# Patient Record
Sex: Female | Born: 1950 | Race: White | Hispanic: No | Smoking: Current every day smoker
Health system: Southern US, Community
[De-identification: ages and names within clinical notes are randomized; demographics above are authoritative.]

## PROBLEM LIST (undated history)

## (undated) DIAGNOSIS — I495 Sick sinus syndrome: Secondary | ICD-10-CM

## (undated) DIAGNOSIS — I480 Paroxysmal atrial fibrillation: Secondary | ICD-10-CM

## (undated) DIAGNOSIS — Z9119 Patient's noncompliance with other medical treatment and regimen: Secondary | ICD-10-CM

## (undated) DIAGNOSIS — I255 Ischemic cardiomyopathy: Secondary | ICD-10-CM

## (undated) DIAGNOSIS — Z91199 Patient's noncompliance with other medical treatment and regimen due to unspecified reason: Secondary | ICD-10-CM

## (undated) DIAGNOSIS — D649 Anemia, unspecified: Secondary | ICD-10-CM

## (undated) DIAGNOSIS — I4892 Unspecified atrial flutter: Secondary | ICD-10-CM

## (undated) DIAGNOSIS — I2510 Atherosclerotic heart disease of native coronary artery without angina pectoris: Secondary | ICD-10-CM

## (undated) DIAGNOSIS — E785 Hyperlipidemia, unspecified: Secondary | ICD-10-CM

## (undated) DIAGNOSIS — J449 Chronic obstructive pulmonary disease, unspecified: Secondary | ICD-10-CM

## (undated) DIAGNOSIS — I5022 Chronic systolic (congestive) heart failure: Secondary | ICD-10-CM

## (undated) DIAGNOSIS — E119 Type 2 diabetes mellitus without complications: Secondary | ICD-10-CM

## (undated) DIAGNOSIS — G2581 Restless legs syndrome: Secondary | ICD-10-CM

## (undated) DIAGNOSIS — I10 Essential (primary) hypertension: Secondary | ICD-10-CM

## (undated) DIAGNOSIS — M1990 Unspecified osteoarthritis, unspecified site: Secondary | ICD-10-CM

## (undated) HISTORY — PX: CARPAL TUNNEL RELEASE: SHX101

## (undated) HISTORY — DX: Essential (primary) hypertension: I10

## (undated) HISTORY — DX: Ischemic cardiomyopathy: I25.5

## (undated) HISTORY — DX: Patient's noncompliance with other medical treatment and regimen due to unspecified reason: Z91.199

## (undated) HISTORY — DX: Hyperlipidemia, unspecified: E78.5

## (undated) HISTORY — DX: Unspecified osteoarthritis, unspecified site: M19.90

## (undated) HISTORY — DX: Atherosclerotic heart disease of native coronary artery without angina pectoris: I25.10

## (undated) HISTORY — DX: Sick sinus syndrome: I49.5

## (undated) HISTORY — DX: Anemia, unspecified: D64.9

## (undated) HISTORY — DX: Patient's noncompliance with other medical treatment and regimen: Z91.19

## (undated) HISTORY — PX: TOTAL ABDOMINAL HYSTERECTOMY: SHX209

## (undated) HISTORY — DX: Chronic systolic (congestive) heart failure: I50.22

## (undated) HISTORY — DX: Chronic obstructive pulmonary disease, unspecified: J44.9

## (undated) HISTORY — DX: Paroxysmal atrial fibrillation: I48.0

## (undated) HISTORY — DX: Type 2 diabetes mellitus without complications: E11.9

## (undated) HISTORY — DX: Restless legs syndrome: G25.81

## (undated) HISTORY — DX: Unspecified atrial flutter: I48.92

---

## 2007-08-10 HISTORY — PX: CORONARY ARTERY BYPASS GRAFT: SHX141

## 2007-08-12 ENCOUNTER — Inpatient Hospital Stay (HOSPITAL_COMMUNITY): Admission: EM | Admit: 2007-08-12 | Discharge: 2007-08-26 | Payer: Self-pay | Admitting: Family Medicine

## 2007-08-12 ENCOUNTER — Ambulatory Visit: Payer: Self-pay | Admitting: Cardiology

## 2007-08-12 ENCOUNTER — Encounter: Payer: Self-pay | Admitting: Family Medicine

## 2007-08-12 ENCOUNTER — Ambulatory Visit: Payer: Self-pay | Admitting: Cardiothoracic Surgery

## 2007-08-15 ENCOUNTER — Encounter: Payer: Self-pay | Admitting: Cardiothoracic Surgery

## 2007-08-16 ENCOUNTER — Encounter: Payer: Self-pay | Admitting: Cardiothoracic Surgery

## 2007-08-30 ENCOUNTER — Ambulatory Visit: Payer: Self-pay | Admitting: Cardiology

## 2007-09-09 ENCOUNTER — Encounter: Admission: RE | Admit: 2007-09-09 | Discharge: 2007-09-09 | Payer: Self-pay | Admitting: Cardiothoracic Surgery

## 2007-09-09 ENCOUNTER — Ambulatory Visit: Payer: Self-pay | Admitting: Cardiothoracic Surgery

## 2007-09-21 ENCOUNTER — Ambulatory Visit: Payer: Self-pay | Admitting: Cardiology

## 2007-10-11 ENCOUNTER — Ambulatory Visit: Payer: Self-pay | Admitting: Cardiology

## 2007-10-13 ENCOUNTER — Ambulatory Visit: Payer: Self-pay | Admitting: Cardiology

## 2007-10-18 ENCOUNTER — Ambulatory Visit: Payer: Self-pay | Admitting: Cardiology

## 2007-10-25 ENCOUNTER — Ambulatory Visit: Payer: Self-pay | Admitting: Cardiology

## 2007-10-31 ENCOUNTER — Ambulatory Visit: Payer: Self-pay | Admitting: Cardiology

## 2007-11-15 ENCOUNTER — Ambulatory Visit: Payer: Self-pay | Admitting: Cardiology

## 2008-01-25 ENCOUNTER — Ambulatory Visit: Payer: Self-pay | Admitting: Cardiology

## 2008-02-13 ENCOUNTER — Ambulatory Visit: Payer: Self-pay | Admitting: Cardiology

## 2008-02-24 ENCOUNTER — Ambulatory Visit: Payer: Self-pay | Admitting: Cardiology

## 2008-03-13 ENCOUNTER — Ambulatory Visit: Payer: Self-pay | Admitting: Cardiology

## 2008-03-16 ENCOUNTER — Ambulatory Visit: Payer: Self-pay | Admitting: Cardiology

## 2008-03-23 ENCOUNTER — Ambulatory Visit: Payer: Self-pay | Admitting: Cardiology

## 2008-03-29 ENCOUNTER — Ambulatory Visit: Payer: Self-pay | Admitting: Cardiology

## 2008-04-06 ENCOUNTER — Ambulatory Visit: Payer: Self-pay | Admitting: Cardiology

## 2008-04-13 ENCOUNTER — Ambulatory Visit: Payer: Self-pay | Admitting: Cardiology

## 2008-04-20 ENCOUNTER — Ambulatory Visit: Payer: Self-pay | Admitting: Cardiothoracic Surgery

## 2008-04-20 ENCOUNTER — Ambulatory Visit: Payer: Self-pay | Admitting: Cardiology

## 2008-05-08 ENCOUNTER — Ambulatory Visit: Payer: Self-pay | Admitting: Cardiology

## 2008-05-18 ENCOUNTER — Ambulatory Visit: Payer: Self-pay | Admitting: Cardiology

## 2008-05-21 ENCOUNTER — Ambulatory Visit: Payer: Self-pay | Admitting: Cardiology

## 2008-05-25 ENCOUNTER — Ambulatory Visit: Payer: Self-pay | Admitting: Cardiothoracic Surgery

## 2008-05-25 ENCOUNTER — Encounter: Admission: RE | Admit: 2008-05-25 | Discharge: 2008-05-25 | Payer: Self-pay | Admitting: Cardiothoracic Surgery

## 2008-06-08 ENCOUNTER — Ambulatory Visit: Payer: Self-pay | Admitting: Cardiology

## 2008-06-11 HISTORY — PX: STERNAL INCISION RECLOSURE: SHX2442

## 2008-06-22 ENCOUNTER — Ambulatory Visit: Payer: Self-pay | Admitting: Cardiothoracic Surgery

## 2008-06-27 ENCOUNTER — Ambulatory Visit: Payer: Self-pay | Admitting: Cardiothoracic Surgery

## 2008-06-27 ENCOUNTER — Inpatient Hospital Stay (HOSPITAL_COMMUNITY): Admission: RE | Admit: 2008-06-27 | Discharge: 2008-06-30 | Payer: Self-pay | Admitting: Cardiothoracic Surgery

## 2008-07-13 ENCOUNTER — Ambulatory Visit: Payer: Self-pay | Admitting: Cardiothoracic Surgery

## 2008-07-16 ENCOUNTER — Ambulatory Visit: Payer: Self-pay | Admitting: Cardiology

## 2008-08-16 ENCOUNTER — Ambulatory Visit: Payer: Self-pay | Admitting: Cardiothoracic Surgery

## 2008-08-31 ENCOUNTER — Ambulatory Visit: Payer: Self-pay | Admitting: Cardiology

## 2008-08-31 ENCOUNTER — Encounter: Admission: RE | Admit: 2008-08-31 | Discharge: 2008-08-31 | Payer: Self-pay | Admitting: Cardiothoracic Surgery

## 2008-08-31 ENCOUNTER — Ambulatory Visit: Payer: Self-pay | Admitting: Cardiothoracic Surgery

## 2008-09-11 ENCOUNTER — Ambulatory Visit: Payer: Self-pay | Admitting: Cardiology

## 2008-09-20 ENCOUNTER — Encounter: Payer: Self-pay | Admitting: Cardiology

## 2008-09-20 ENCOUNTER — Ambulatory Visit: Payer: Self-pay | Admitting: Cardiology

## 2008-10-29 ENCOUNTER — Encounter: Payer: Self-pay | Admitting: Physician Assistant

## 2008-10-29 ENCOUNTER — Ambulatory Visit: Payer: Self-pay | Admitting: Cardiology

## 2008-10-30 ENCOUNTER — Encounter: Payer: Self-pay | Admitting: Cardiology

## 2008-11-01 ENCOUNTER — Telehealth: Payer: Self-pay | Admitting: Cardiology

## 2008-11-02 ENCOUNTER — Encounter: Payer: Self-pay | Admitting: Cardiology

## 2008-11-05 ENCOUNTER — Encounter: Payer: Self-pay | Admitting: Cardiology

## 2008-11-13 ENCOUNTER — Encounter: Payer: Self-pay | Admitting: Cardiology

## 2008-12-14 ENCOUNTER — Ambulatory Visit: Payer: Self-pay | Admitting: Cardiology

## 2008-12-14 ENCOUNTER — Encounter: Payer: Self-pay | Admitting: Physician Assistant

## 2009-01-22 DIAGNOSIS — E785 Hyperlipidemia, unspecified: Secondary | ICD-10-CM

## 2009-01-22 DIAGNOSIS — I4892 Unspecified atrial flutter: Secondary | ICD-10-CM | POA: Insufficient documentation

## 2009-02-04 ENCOUNTER — Encounter: Payer: Self-pay | Admitting: Cardiology

## 2009-02-15 ENCOUNTER — Encounter: Payer: Self-pay | Admitting: Cardiology

## 2009-02-15 ENCOUNTER — Ambulatory Visit: Payer: Self-pay | Admitting: Cardiology

## 2009-02-17 ENCOUNTER — Encounter: Payer: Self-pay | Admitting: Cardiology

## 2009-02-17 ENCOUNTER — Telehealth (INDEPENDENT_AMBULATORY_CARE_PROVIDER_SITE_OTHER): Payer: Self-pay | Admitting: Physician Assistant

## 2009-02-19 ENCOUNTER — Encounter: Payer: Self-pay | Admitting: Cardiology

## 2009-02-20 ENCOUNTER — Encounter: Payer: Self-pay | Admitting: Cardiology

## 2009-02-21 ENCOUNTER — Encounter: Payer: Self-pay | Admitting: Cardiology

## 2009-02-25 ENCOUNTER — Telehealth (INDEPENDENT_AMBULATORY_CARE_PROVIDER_SITE_OTHER): Payer: Self-pay

## 2009-02-26 ENCOUNTER — Ambulatory Visit: Payer: Self-pay

## 2009-02-26 ENCOUNTER — Ambulatory Visit: Payer: Self-pay | Admitting: Cardiovascular Disease

## 2009-02-26 ENCOUNTER — Encounter (HOSPITAL_COMMUNITY): Admission: RE | Admit: 2009-02-26 | Discharge: 2009-05-09 | Payer: Self-pay | Admitting: Internal Medicine

## 2009-03-14 ENCOUNTER — Encounter: Payer: Self-pay | Admitting: Cardiology

## 2009-03-18 ENCOUNTER — Ambulatory Visit: Payer: Self-pay | Admitting: Internal Medicine

## 2009-03-18 DIAGNOSIS — I495 Sick sinus syndrome: Secondary | ICD-10-CM

## 2009-03-19 ENCOUNTER — Encounter: Payer: Self-pay | Admitting: Internal Medicine

## 2009-03-19 ENCOUNTER — Telehealth (INDEPENDENT_AMBULATORY_CARE_PROVIDER_SITE_OTHER): Payer: Self-pay | Admitting: *Deleted

## 2009-03-22 ENCOUNTER — Ambulatory Visit: Payer: Self-pay | Admitting: Cardiology

## 2009-03-22 LAB — CONVERTED CEMR LAB: POC INR: 1.9

## 2009-03-26 ENCOUNTER — Ambulatory Visit: Payer: Self-pay | Admitting: Cardiology

## 2009-03-26 ENCOUNTER — Encounter: Payer: Self-pay | Admitting: Internal Medicine

## 2009-03-26 ENCOUNTER — Encounter (INDEPENDENT_AMBULATORY_CARE_PROVIDER_SITE_OTHER): Payer: Self-pay | Admitting: *Deleted

## 2009-03-26 ENCOUNTER — Telehealth: Payer: Self-pay | Admitting: Internal Medicine

## 2009-03-26 LAB — CONVERTED CEMR LAB: POC INR: 4.7

## 2009-03-28 ENCOUNTER — Encounter: Payer: Self-pay | Admitting: Internal Medicine

## 2009-03-29 ENCOUNTER — Encounter: Payer: Self-pay | Admitting: Cardiology

## 2009-03-29 ENCOUNTER — Encounter: Payer: Self-pay | Admitting: Internal Medicine

## 2009-03-29 ENCOUNTER — Inpatient Hospital Stay (HOSPITAL_COMMUNITY): Admission: RE | Admit: 2009-03-29 | Discharge: 2009-03-30 | Payer: Self-pay | Admitting: Internal Medicine

## 2009-03-29 ENCOUNTER — Ambulatory Visit: Payer: Self-pay | Admitting: Cardiology

## 2009-03-29 HISTORY — PX: CARDIAC DEFIBRILLATOR PLACEMENT: SHX171

## 2009-03-30 ENCOUNTER — Encounter: Payer: Self-pay | Admitting: Internal Medicine

## 2009-04-01 ENCOUNTER — Encounter: Payer: Self-pay | Admitting: Internal Medicine

## 2009-04-02 ENCOUNTER — Ambulatory Visit: Payer: Self-pay | Admitting: Cardiology

## 2009-04-02 LAB — CONVERTED CEMR LAB: POC INR: 2

## 2009-04-09 ENCOUNTER — Ambulatory Visit: Payer: Self-pay | Admitting: Cardiology

## 2009-04-09 LAB — CONVERTED CEMR LAB: POC INR: 3.1

## 2009-04-15 ENCOUNTER — Encounter: Payer: Self-pay | Admitting: Physician Assistant

## 2009-04-15 ENCOUNTER — Ambulatory Visit: Payer: Self-pay | Admitting: Cardiology

## 2009-04-17 ENCOUNTER — Encounter: Payer: Self-pay | Admitting: Internal Medicine

## 2009-04-17 ENCOUNTER — Ambulatory Visit: Payer: Self-pay

## 2009-04-19 ENCOUNTER — Telehealth (INDEPENDENT_AMBULATORY_CARE_PROVIDER_SITE_OTHER): Payer: Self-pay | Admitting: *Deleted

## 2009-05-29 ENCOUNTER — Encounter (INDEPENDENT_AMBULATORY_CARE_PROVIDER_SITE_OTHER): Payer: Self-pay | Admitting: Cardiology

## 2009-06-12 ENCOUNTER — Ambulatory Visit (HOSPITAL_COMMUNITY): Admission: RE | Admit: 2009-06-12 | Discharge: 2009-06-12 | Payer: Self-pay | Admitting: Internal Medicine

## 2009-06-12 ENCOUNTER — Encounter: Payer: Self-pay | Admitting: Cardiology

## 2009-06-13 ENCOUNTER — Encounter: Payer: Self-pay | Admitting: Cardiology

## 2009-06-16 ENCOUNTER — Encounter: Payer: Self-pay | Admitting: Cardiology

## 2009-07-05 IMAGING — CR DG CHEST 2V
2 series · 2 of 2 positions shown · non-contrast
Comparison: 05/25/2008

CLINICAL DATA: Sternal malunion.  Preadmission.

CHEST - 2 VIEW

[view not recorded (1 of 2)]
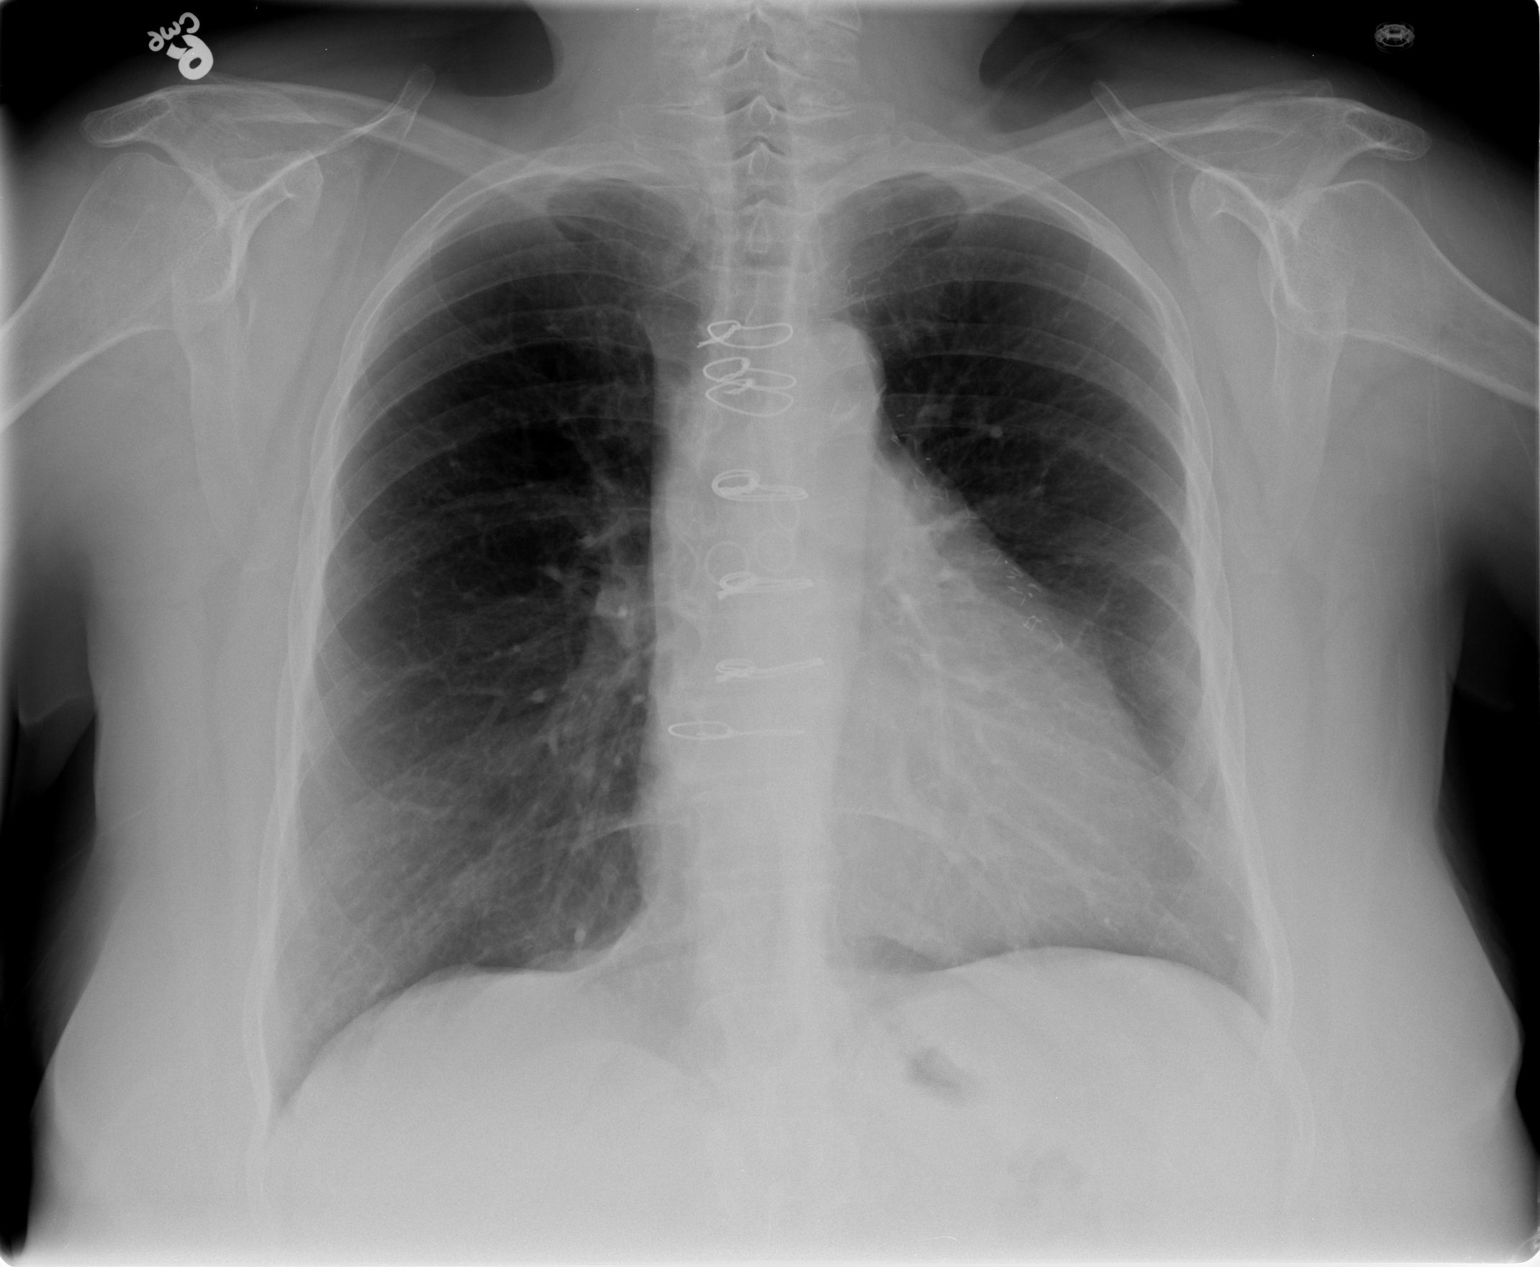

[view not recorded (2 of 2)]
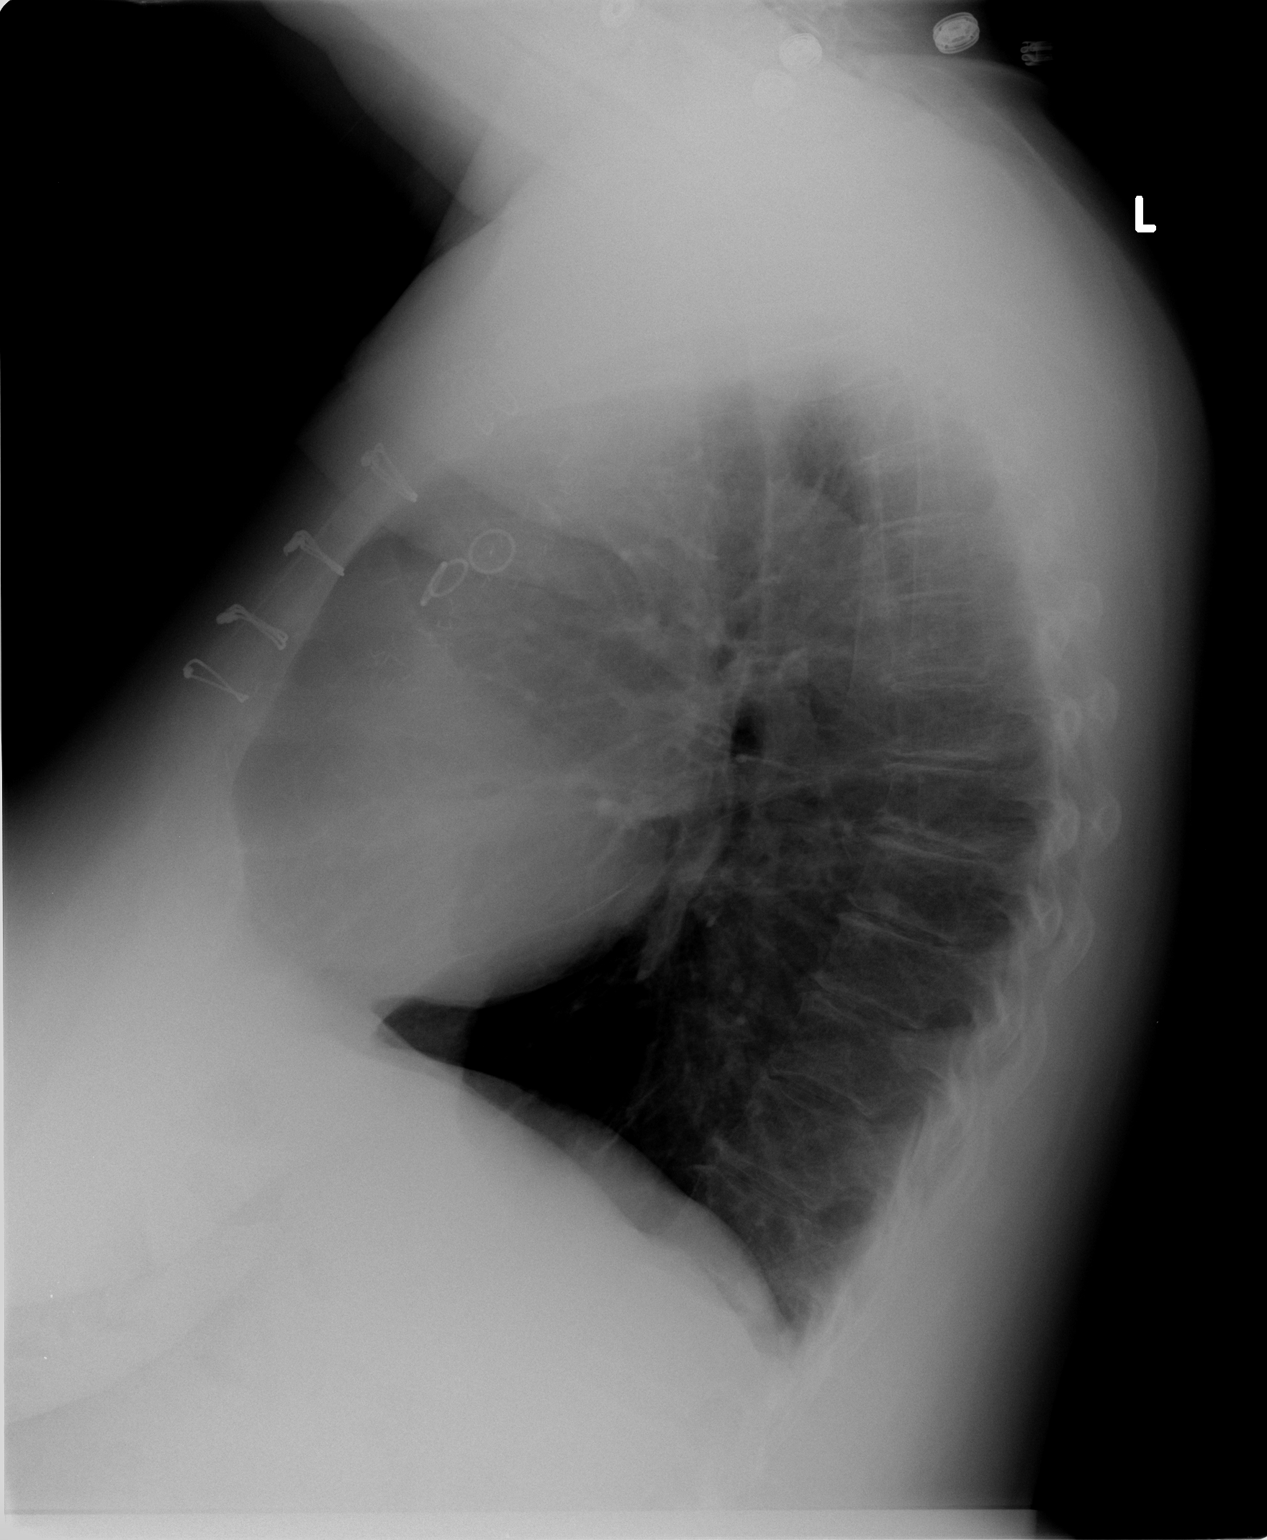

[2 of 2 positions shown; findings below may reference images not displayed]

FINDINGS: Trachea is midline. Heart is enlarged, stable.  Sternal
wires are unchanged in position from 09/09/2007.  A probable
summation shadow is seen at the base of the right hemithorax.
Subsegmental atelectasis or scarring is seen in the lingula. Lungs
are otherwise clear.  No pleural fluid.
IMPRESSION: No acute findings.

## 2009-08-02 ENCOUNTER — Ambulatory Visit: Payer: Self-pay | Admitting: Internal Medicine

## 2009-09-06 ENCOUNTER — Ambulatory Visit: Payer: Self-pay | Admitting: Cardiology

## 2009-09-06 DIAGNOSIS — I10 Essential (primary) hypertension: Secondary | ICD-10-CM

## 2009-09-06 DIAGNOSIS — Z720 Tobacco use: Secondary | ICD-10-CM

## 2009-09-06 DIAGNOSIS — I2589 Other forms of chronic ischemic heart disease: Secondary | ICD-10-CM | POA: Insufficient documentation

## 2009-09-06 DIAGNOSIS — I2510 Atherosclerotic heart disease of native coronary artery without angina pectoris: Secondary | ICD-10-CM

## 2009-09-12 ENCOUNTER — Encounter: Payer: Self-pay | Admitting: Cardiology

## 2009-09-16 ENCOUNTER — Telehealth (INDEPENDENT_AMBULATORY_CARE_PROVIDER_SITE_OTHER): Payer: Self-pay | Admitting: *Deleted

## 2009-10-10 ENCOUNTER — Ambulatory Visit: Payer: Self-pay | Admitting: Cardiology

## 2009-10-29 ENCOUNTER — Ambulatory Visit: Payer: Self-pay | Admitting: Internal Medicine

## 2009-11-14 ENCOUNTER — Encounter: Payer: Self-pay | Admitting: Internal Medicine

## 2009-11-15 ENCOUNTER — Ambulatory Visit: Payer: Self-pay | Admitting: Cardiology

## 2009-11-15 DIAGNOSIS — I5022 Chronic systolic (congestive) heart failure: Secondary | ICD-10-CM

## 2009-12-04 ENCOUNTER — Encounter: Payer: Self-pay | Admitting: Cardiology

## 2009-12-13 ENCOUNTER — Encounter: Payer: Self-pay | Admitting: Cardiology

## 2009-12-17 ENCOUNTER — Encounter: Payer: Self-pay | Admitting: Cardiology

## 2010-01-30 ENCOUNTER — Ambulatory Visit: Payer: Self-pay | Admitting: Internal Medicine

## 2010-02-07 ENCOUNTER — Ambulatory Visit: Payer: Self-pay | Admitting: Internal Medicine

## 2010-03-05 ENCOUNTER — Encounter: Payer: Self-pay | Admitting: Cardiology

## 2010-03-12 ENCOUNTER — Encounter: Payer: Self-pay | Admitting: Internal Medicine

## 2010-03-14 ENCOUNTER — Ambulatory Visit: Payer: Self-pay | Admitting: Internal Medicine

## 2010-03-14 LAB — CONVERTED CEMR LAB
Basophils Absolute: 0.1 10*3/uL (ref 0.0–0.1)
CO2: 29 meq/L (ref 19–32)
Calcium: 9.4 mg/dL (ref 8.4–10.5)
Chloride: 97 meq/L (ref 96–112)
Creatinine, Ser: 1.7 mg/dL — ABNORMAL HIGH (ref 0.4–1.2)
Glucose, Bld: 285 mg/dL — ABNORMAL HIGH (ref 70–99)
Hemoglobin: 10.5 g/dL — ABNORMAL LOW (ref 12.0–15.0)
Lymphocytes Relative: 21.9 % (ref 12.0–46.0)
Lymphs Abs: 1.6 10*3/uL (ref 0.7–4.0)
MCHC: 34.6 g/dL (ref 30.0–36.0)
Monocytes Absolute: 0.5 10*3/uL (ref 0.1–1.0)
Neutrophils Relative %: 65.3 % (ref 43.0–77.0)
Platelets: 282 10*3/uL (ref 150.0–400.0)
Sodium: 136 meq/L (ref 135–145)

## 2010-03-19 ENCOUNTER — Telehealth: Payer: Self-pay | Admitting: Internal Medicine

## 2010-03-20 ENCOUNTER — Telehealth: Payer: Self-pay | Admitting: Internal Medicine

## 2010-04-01 ENCOUNTER — Ambulatory Visit (HOSPITAL_COMMUNITY)
Admission: RE | Admit: 2010-04-01 | Discharge: 2010-04-01 | Payer: Self-pay | Source: Home / Self Care | Admitting: Internal Medicine

## 2010-04-01 ENCOUNTER — Ambulatory Visit: Payer: Self-pay | Admitting: Internal Medicine

## 2010-05-08 ENCOUNTER — Ambulatory Visit: Payer: Self-pay | Admitting: Internal Medicine

## 2010-05-14 ENCOUNTER — Encounter: Payer: Self-pay | Admitting: Cardiology

## 2010-05-21 ENCOUNTER — Encounter (INDEPENDENT_AMBULATORY_CARE_PROVIDER_SITE_OTHER): Payer: Self-pay | Admitting: *Deleted

## 2010-06-02 ENCOUNTER — Ambulatory Visit
Admission: RE | Admit: 2010-06-02 | Discharge: 2010-06-02 | Payer: Self-pay | Source: Home / Self Care | Attending: Internal Medicine | Admitting: Internal Medicine

## 2010-06-02 ENCOUNTER — Encounter: Payer: Self-pay | Admitting: Internal Medicine

## 2010-06-02 DIAGNOSIS — N182 Chronic kidney disease, stage 2 (mild): Secondary | ICD-10-CM | POA: Insufficient documentation

## 2010-06-03 ENCOUNTER — Encounter: Payer: Self-pay | Admitting: Internal Medicine

## 2010-06-10 NOTE — Medication Information (Signed)
Summary: Coumadin Clinic  Anticoagulant Therapy  Managed by: Inactive Referring MD: Diona Browner PCP: Dr. Erasmo Downer Supervising MD: Andee Lineman MD, Michelle Piper Indication 1: Atrial Fibrillation Lab Used: [facility] [place] Site: [place]          Comments: Coumadin discontinued 1 month post ablation per Dr Diona Browner office note.  Allergies: 1)  ! Pcn 2)  ! * Esgesic 3)  ! Nsaids 4)  ! * Latex  Anticoagulation Management History:      Negative risk factors for bleeding include an age less than 32 years old.  The bleeding index is 'low risk'.  Positive CHADS2 values include History of CHF and History of HTN.  Negative CHADS2 values include Age > 73 years old.  Anticoagulation responsible provider: Andee Lineman MD, Michelle Piper.    Anticoagulation Management Assessment/Plan:      The patient's current anticoagulation dose is Coumadin 5 mg tabs: take one daily as directed.  The next INR is due 04/16/2009.  Anticoagulation instructions were given to patient.  Results were reviewed/authorized by Inactive.         Prior Anticoagulation Instructions: INR 3.1 Decrease coumadin to 2.5mg  once daily except 5mg  on Sundays and Thursdays

## 2010-06-10 NOTE — Cardiovascular Report (Signed)
Summary: Office Visit Remote   Office Visit Remote   Imported By: Roderic Ovens 02/10/2010 16:06:47  _____________________________________________________________________  External Attachment:    Type:   Image     Comment:   External Document

## 2010-06-10 NOTE — Cardiovascular Report (Signed)
Summary: Office Visit Remote   Office Visit Remote   Imported By: Roderic Ovens 11/19/2009 13:29:22  _____________________________________________________________________  External Attachment:    Type:   Image     Comment:   External Document

## 2010-06-10 NOTE — Letter (Signed)
Summary: Pre-Operative Orders   Pre-Operative Orders   Imported By: Kassie Mends 05/14/2009 09:26:10  _____________________________________________________________________  External Attachment:    Type:   Image     Comment:   External Document

## 2010-06-10 NOTE — Letter (Signed)
Summary: Custom - Delinquent Coumadin 1  [facility]  [address]   [place], [place] [address]   Phone: [phone]  Fax: [phone]     May 29, 2009 MRN: [id number]   [facility] [address] [place], [place]  [address]   Dear Ms. Anselmi,  This letter is being sent to you as a reminder that it is necessary for you to get your INR/PT checked regularly so that we can optimize your care.  Our records indicate that you were scheduled to have a test done recently.  As of today, we have not received the results of this test.  It is very important that you have your INR checked.  Please call our office at the number listed above to schedule an appointment at your earliest convenience.    If you have recently had your protime checked or have discontinued this medication, please contact our office at the above phone number to clarify this issue.  Thank you for this prompt attention to this important health care matter.  Sincerely, Vashti Hey RN  [facility] Cardiovascular [facility] Reduction [facility] Team   Please let our office know if you are no longer taking coumadin or if it is being managed by another physican so we can update our records.

## 2010-06-10 NOTE — Progress Notes (Signed)
Summary: Questions NU:UVOZ check device  Phone Note Call from Patient Call back at Home Phone [phone]   Caller: Daughter Summary of Call: Pt's dgt called regarding "Medtronic Monitor." She wants to know if she needs to do the set up now or wait until the day of the download, if so when is the day of download. Notified pt's dgt, Danielle, that she would need to discuss this with one of the device nurses Gunnar Fusi or [facility]).  Initial call taken by: Cyril Loosen, RN, BSN,  Sep 16, 2009 3:52 PM  Follow-up for Phone Call        Pt does not have phone line in bedroom and is unable to do wireless transmissions. She will only plug in for transmissions.  Advised this was okay.Gypsy Balsam RN BSN  Sep 16, 2009 4:18 PM

## 2010-06-10 NOTE — Cardiovascular Report (Signed)
Summary: Card Device Clinic/ QUICK LOOK  Card Device Clinic/ QUICK LOOK   Imported By: Dorise Hiss 08/06/2009 17:19:56  _____________________________________________________________________  External Attachment:    Type:   Image     Comment:   External Document

## 2010-06-10 NOTE — Cardiovascular Report (Signed)
Summary: Office Visit   Office Visit   Imported By: Roderic Ovens 02/10/2010 16:04:59  _____________________________________________________________________  External Attachment:    Type:   Image     Comment:   External Document

## 2010-06-10 NOTE — Cardiovascular Report (Signed)
Summary: Pre Op Orders   Pre Op Orders   Imported By: Roderic Ovens 03/24/2010 14:37:16  _____________________________________________________________________  External Attachment:    Type:   Image     Comment:   External Document

## 2010-06-10 NOTE — Assessment & Plan Note (Signed)
Summary: EVAL PER DR. ALLRED, PT STOPPED MEDS-JM   Visit Type:  Follow-up Primary Provider:  Dr. Erasmo Downer   History of Present Illness: 60 year old woman presents for follow-up.  She is here with her daughter. It has been noted that she has stopped most of her medications since I last saw her - both by Dr. Johney Frame and Dr. Linna Darner at their respective office visits. No medicines have been resumed or were added at these visits.  She states that she feels "really good." She was having prior problems with tremors, weakness, and nausea, all of which have resolved. She states that her appetite has increased and she has been urinating more frequently. She is not checking her blood sugar at all at home now. She reports no problems with lower extremity edema or orthopnea. No sense of palpitations.  Today I discussed her prior medical regimen, and reviewed the remaining medications that are detailed below. I also reminded her that in stopping all of her medications, she is no longer being treated for diabetes mellitus or hypothyroidism, both issues followed by Dr. Linna Darner. At this point she is not interested in resuming any of her previous medications, although is willing to undergo repeat baseline lab work, at least consider the addition of an antihypertensive.  Current Medications (verified): 1)  Lipitor 20 Mg Tabs (Atorvastatin Calcium) .... Take 1 Tablet By Mouth At Bedtime 2)  Ambien 10 Mg Tabs (Zolpidem Tartrate) .... At Bedtime 3)  Pramipexole Dihydrochloride 0.25 Mg Tabs (Pramipexole Dihydrochloride) .... Take 1 Tablet By Mouth Two Times A Day 4)  Hydrocodone-Acetaminophen 5-500 Mg Tabs (Hydrocodone-Acetaminophen) .... Take 1 Tablet By Mouth Two Times A Day  Allergies (verified): 1)  ! Pcn 2)  ! * Esgesic 3)  ! Nsaids 4)  ! * Latex  Comments:  Nurse/Medical Assistant: The patient's medications and allergies were reviewed with the patient and were updated in the Medication and Allergy  Lists. Patient states that she came off all medsfor two months now except ambien,mirapex,vicodin,and lipitor.  Past History:  Past Surgical History: Last updated: 04/15/2009 Carpel tunnel Abdominal Hysterectomy-Total CABG, April 2009, LIMA to LAD, SVG to ramus, SVG to circumflex marginal, SVG to posterior descending Sternal incision revision February 2010  Social History: Last updated: 03/18/2009 Disabled.  Lives in [place].  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  The patient denies chest pain, syncope, peripheral edema, melena, hematochezia, and severe indigestion/heartburn.         Otherwise negative except as outlined.  Vital Signs:  Patient profile:   60 year old female Height:      62 inches Weight:      202 pounds Pulse rate:   66 / minute BP sitting:   139 / 78  (left arm) Cuff size:   large  Vitals Entered By: Carlye Grippe (September 06, 2009 1:29 PM)  Physical Exam  Additional Exam:  Overweight, chronically ill-appearing woman, in no acute distress. HEENT: Conjunctiva and lids normal, oropharynx with poor dentition. Neck: Supple, increased girth, no bruits or obvious elevation  in JVP. Lungs: Diminished breath sounds, nonlabored. Cardiac: Regular rate and rhythm, indistinct PMI, no S3. Abdomen: Obese, soft, bowel sounds present. Extremities: No pitting edema, distal pulses one plus. Skin: Warm and dry. Musculoskeletal: No kyphosis. Neuropsychiatric: Alert and oriented x3, affect grossly  appropriate.   EKG  Procedure date:  09/06/2009  Findings:      Atrial paced rhythm at 72 beats per minute, evidence of previous infralateral myocardial infarction.   ICD Specifications Following MD:  Hillis Range, MD     ICD Vendor:  Medtronic     ICD Model Number:  [id number]     ICD Serial Number:  [id number] ICD DOI:  03/29/2009     ICD Implanting MD:  Hillis Range, MD  Lead 1:    Location: RA     DOI: 03/29/2009     Model #: [id number]     Serial #: [id number]     Status: active Lead 2:    Location: RV     DOI: 03/29/2009     Model #: [id number]     Serial #: [id number]     Status: active  Indications::  CM   ICD Follow Up ICD Dependent:  No      Episodes Coumadin:  No  Brady Parameters Mode DDDR+     Lower Rate Limit:  60     Upper Rate Limit 130 PAV 180     Sensed AV Delay:  150  Tachy Zones VF:  200     Impression & Recommendations:  Problem # 1:  ISCHEMIC CARDIOMYOPATHY (ICD-414.8)  Patient reports clinical stability, denying angina, or progressive breathlessness. She describes increasing weight, although no lower extremity edema, and has attributed this to an increase in her appetite. She is on no medications for her cardiomyopathy or cardiovascular disease. She has not been able to take aspirin or Coumadin related to recurrent gastrointestinal bleeding. Blood pressure is not optimally controlled. I discussed this with the patient, and have recommended a repeat baseline laboratory assessment. We may be able to better choose an antihypertensive with afterload reducing properties to add back to her regimen. Medical regimen can be further advanced if she permits.  The following medications were removed from the medication list:    Amiodarone Hcl 200 Mg Tabs (Amiodarone hcl) .Marland Kitchen... Take one tablet by mouth once daily    Bumetanide 1 Mg Tabs (Bumetanide) .Marland Kitchen... 2 tabs two times a day  Problem # 2:  ATRIAL FLUTTER (ICD-427.32)  Patient status post atrial flutter ablation, and also  placement of a Medtronic defibrillator back in November 2010. I suspect that one of the reasons she is feeling better, is really is related to the fact that her rhythm is much better controlled, specifically without further problems with tachycardia bradycardia syndrome. She denies any device discharges, and had normal device function at her last visit with Dr. Johney Frame. She has had problems with both aspirin and Coumadin related to recurrent gastrointestinal hemorrhage, and is no longer being anticoagulated.  The following medications were removed from the medication list:    Amiodarone Hcl 200 Mg Tabs (Amiodarone hcl) .Marland Kitchen... Take one tablet by mouth once daily  Orders: EKG w/ Interpretation (93000) T-Comprehensive Metabolic Panel ([id number]) T-TSH ([id number]) T-CBC No Diff ([id number]) T-Lipid Profile (59563-87564)  Problem # 3:  ESSENTIAL HYPERTENSION, BENIGN (ICD-401.1)  Blood pressure not optimally controlled. Hopefully will be able to address this via medical therapy if the patient permits.  The following medications were removed from the medication list:  Bumetanide 1 Mg Tabs (Bumetanide) .Marland Kitchen... 2 tabs two times a day  Problem # 4:  HYPERLIPIDEMIA-MIXED (ICD-272.4)  Patient has continued taking Lipitor. Will followup fasting lipid profile and liver function tests.  Her updated medication list for this problem includes:    Lipitor 20 Mg Tabs (Atorvastatin calcium) .Marland Kitchen... Take 1 tablet by mouth at bedtime  Orders: T-Comprehensive Metabolic Panel ([id number]) T-TSH [phone]) T-CBC No Diff ([id number]) T-Lipid Profile (10272-53664)  Problem # 5:  PERS HX NONCOMPLIANCE W/MED TX PRS HAZARDS HLTH (ICD-V15.81)  I discussed noncompliance with medical therapy frankly with the patient today. She states that she does feel better, and may in fact have been experiencing side effects with some of her previous medications, however there are clearly negative implications to electing  to forego treatment for diabetes mellitus, hypothyroidism, anemia, and chronic lung disease. I will forward copies of the repeat baseline lab work to Dr. Linna Darner, who plans to see the patient back later in May. I am hopeful that he will be able to further address her noncardiac medical conditions going forward.  Patient Instructions: 1)  Your physician recommends that you go to the [facility] for lab work. Do not eat or drink after midnight. 2)  Follow up appt with Dr. Diona Browner on Thursday, June 2nd at 9:30am.

## 2010-06-10 NOTE — Assessment & Plan Note (Signed)
Summary: pc2/jml/ r/s for [place] office per paula   Visit Type:  Pacemaker check Referring Provider:  Dr Diona Browner Primary Provider:  Dr. Erasmo Downer  CC:  pacer chck.  History of Present Illness: The patient presents today for routine electrophysiology followup.  She reports having symptoms of progressive fatigue, tremors, and unsteadiness over the past few months.  She also had nausea and decreased oral appetite.  She then quit taking all of her medicines and reports that these symptoms all completely resolved.  She reports that she is now much more active.  She is happy with her current health state and is without complaint today.  The patient denies symptoms of palpitations, chest pain, shortness of breath, orthopnea, PND, lower extremity edema, dizziness, presyncope, syncope, or neurologic sequela.   Preventive Screening-Counseling & Management  Alcohol-Tobacco     Smoking Status: quit     Year Quit: 2007  Current Medications (verified): 1)  Advair Diskus 250-50 Mcg/dose Misc (Fluticasone-Salmeterol) .... Inhale 1 Puff As Directed Twice A Day 2)  Lipitor 20 Mg Tabs (Atorvastatin Calcium) .... Take 1 Tablet By Mouth At Bedtime 3)  Amiodarone Hcl 200 Mg Tabs (Amiodarone Hcl) .... Take One Tablet By Mouth Once Daily 4)  Folic Acid   Powd (Folic Acid) .... 1mg  Once Daily 5)  Bumetanide 1 Mg Tabs (Bumetanide) .... 2 Tabs Two Times A Day 6)  Carbidopa-Levodopa 10-100 Mg Tabs (Carbidopa-Levodopa) .... Two Times A Day 7)  Levothyroxine Sodium 50 Mcg Tabs (Levothyroxine Sodium) .... Once Daily 8)  Ambien 10 Mg Tabs (Zolpidem Tartrate) .... At Bedtime 9)  Ferrous Sulfate 325 (65 Fe) Mg  Tabs (Ferrous Sulfate) .... Once Daily 10)  Zegerid 20-1100 Mg Caps (Omeprazole-Sodium Bicarbonate) .... Once Daily 11)  Fluoxetine Hcl 20 Mg Caps (Fluoxetine Hcl) .... Once Daily 12)  Poly-Iron 150 Forte 150-25-1 Mg-Mcg-Mg Caps (Iron Polysacch Cmplx-B12-Fa) .... Once Daily 13)  Metformin Hcl 500 Mg Tabs  (Metformin Hcl) .... Two Times A Day 14)  Theratrum Complete 50 Plus  Tabs (Multiple Vitamins-Minerals) .... Once Daily 15)  Lantus 100 Unit/ml Soln (Insulin Glargine) .... As Directed 16)  Novolin 70/30 70-30 % Susp (Insulin Isophane & Regular) .... As Directed 17)  Mirapex 1.5 Mg Tabs (Pramipexole Dihydrochloride) .... Take 1 Tablet By Mouth Once A Day 18)  Alprazolam 0.25 Mg Tabs (Alprazolam) .... Take 1 Tablet By Mouth Once A Day At Bedtime  Allergies (verified): 1)  ! Pcn 2)  ! * Esgesic 3)  ! Nsaids 4)  ! * Latex  Comments:  Nurse/Medical Assistant: The patient's medications and allergies were reviewed with the patient and were updated in the Medication and Allergy Lists. Brought pre-package meds and said that she hasn't taken any meds for 2weeks.  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 Carpel tunnel Abdominal Hysterectomy-Total CABG, April 2009, LIMA to LAD, SVG to ramus, SVG to circumflex marginal, SVG to posterior descending Sternal incision revision February 2010  Social History: Reviewed history from 03/18/2009 and no changes required. Disabled.  Lives in [place].  Divorced Tobacco Use - quit 2007, prior 3-4 PPD Alcohol Use - remote heavy Drug Use - marijuana  Vital Signs:  Patient profile:  60 year old female Height:      62 inches Weight:      192 pounds Pulse rate:   68 / minute BP sitting:   110 / 64  (left arm) Cuff size:   large  Vitals Entered By: Carlye Grippe (August 02, 2009 1:54 PM) CC: pacer chck   Physical Exam  General:  chronically ill, NAD Head:  normocephalic  and atraumatic Eyes:  PERRLA/EOM intact; conjunctiva and lids normal. Mouth:  Teeth, gums and palate normal. Oral mucosa normal. Neck:  no carotid bruits Chest Wall:  ICD pocket is well healed Lungs:  Clear bilaterally to auscultation and percussion. Heart:  RRR, no m/r/g Abdomen:  Bowel sounds positive; abdomen soft and non-tender without masses, organomegaly, or hernias noted. No hepatosplenomegaly. Msk:  Back normal, normal gait. Muscle strength and tone normal. Pulses:  pulses normal in all 4 extremities Extremities:  No clubbing or cyanosis.  no edema Neurologic:  Alert and oriented x 3.  nonfocal    ICD Specifications Following MD:  Hillis Range, MD     ICD Vendor:  Medtronic     ICD Model Number:  [id number]     ICD Serial Number:  [id number] ICD DOI:  03/29/2009     ICD Implanting MD:  Hillis Range, MD  Lead 1:    Location: RA     DOI: 03/29/2009     Model #: [id number]     Serial #: [id number]     Status: active Lead 2:    Location: RV     DOI: 03/29/2009     Model #: [id number]     Serial #: [id number]     Status: active  Indications::  CM   ICD Follow Up Remote Check?  No Battery Voltage:  3.18 V     Charge Time:  8.5 seconds     Underlying rhythm:  SR2.9 ICD Dependent:  No       ICD Device Measurements Atrium:  Amplitude: 2.9 mV, Impedance: 437 ohms, Threshold: 0.5 V at 0.4 msec Right Ventricle:  Amplitude: 5.1 mV, Impedance: 418 ohms, Threshold: 1.5 V at 0.4 msec Shock Impedance: 57 ohms   Episodes MS Episodes:  0     Percent Mode Switch:  0     Coumadin:  No Shock:  0     ATP:  0     Nonsustained:  0     Atrial Pacing:  88.7%     Ventricular Pacing:  0.2%  Brady Parameters Mode DDDR+     Lower Rate Limit:  60     Upper Rate Limit 130 PAV 180     Sensed AV Delay:  150  Tachy Zones VF:  200     Next Remote Date:  10/29/2009     Next Cardiology Appt Due:  07/10/2010 Tech Comments:  outputs reprogrammed for chronic thresholds.  Atrial sensitivity to 0.26mV for FFRW. Optivol  and thoracic impedance normal. [facility] transmisssions every 3months with a ROV in one year with Dr. Johney Frame in [place]. Altha Harm, LPN  August 02, 2009 2:10 PM  MD Comments:  agree with above  Impression & Recommendations:  Problem # 1:  SYSTOLIC HEART FAILURE, ACUTE ON CHRONIC (ICD-428.23) She has chronic systolic dysfunction and is s/p ICD.  Her ICD is functioning normally today.  No changes today.  Problem # 2:  BRADYCARDIA-TACHYCARDIA SYNDROME (ICD-427.81) maintaining sinus rhythm.  No recent afib. No atrial flutter since CTI ablation. I would be reluctant to restart amiodarone  in the future as her previous symtoms of tremors, unsteadiness,  and nausea may have been due to amiodarone.  Her CHADS 2 score is elevated. She declines all medicine including coumadin at this time.  Problem # 3:  HYPERTENSION, UNSPECIFIED (ICD-401.9) stable off of medicines  Problem # 4:  HYPERLIPIDEMIA-MIXED (ICD-272.4) she has stopped lipitor  Patient Instructions: 1)  the importance with compliance with CHF medicines was discussed with the patient.  I will have her follow-up with Dr Diona Browner at the next available time.  She will have her device followed by [facility] and I will see her annually.

## 2010-06-10 NOTE — Letter (Signed)
Summary: [facility] D/C DR. Lia Hopping  [facility] D/C DR. XAJE HASANAJ   Imported By: Zachary George 09/05/2009 16:28:50  _____________________________________________________________________  External Attachment:    Type:   Image     Comment:   External Document

## 2010-06-10 NOTE — Letter (Signed)
Summary: Work Writer, Main Office  [address]   [place], [place] [address]   Phone: [phone]  Fax: [phone]     February 07, 2010    [facility]   The above named patient had a medical visit today at 11:30  am with Dr Johney Frame .  Her daughter brought her to the appointment.    Please take this into consideration when reviewing the time away from school.      Sincerely yours,    Anselm Pancoast

## 2010-06-10 NOTE — Letter (Signed)
Summary: Remote Device Check  [employer], Main Office  [address]   [place], [place] [address]   Phone: [phone]  Fax: [phone]     November 14, 2009 MRN: [id number]   [facility] [address] [place], [place]  [address]   Dear Ms. Echavarria,   Your remote transmission was recieved and reviewed by your physician.  All diagnostics were within normal limits for you.  __X___Your next transmission is scheduled for:   01-30-10.  Please transmit at any time this day.  If you have a wireless device your transmission will be sent automatically.   Sincerely,  Vella Kohler

## 2010-06-10 NOTE — Letter (Signed)
Summary: Implantable Device Instructions  Architectural technologist, Main Office  [address]   [place], [place] [address]   Phone: [phone]  Fax: [phone]      Implantable Device Instructions  You are scheduled for:  DFT's +/- lead revision  on 03/21/10 with Dr Ladona Ridgel.  1.  Please arrive at the Short Stay Center at [facility] at 10:30am on the day of your procedure.  2.  Do not eat or drink after midnight  the night before your procedure.  3.  Complete lab work on 03/14/10 at 2pm.  .  You do not have to be fasting.  4.  Do NOT take these medications for the morning of your procedure:  Metformin 1/2 am Insulin dose.    5.  Plan for an overnight stay.  Bring your insurance cards and a list of your medications.  6.  Wash your chest and neck with antibacterial soap (any brand) the evening before and the morning of your procedure.  Rinse well.   *If you have ANY questions after you get home, please call the office [phone]. Anselm Pancoast  *Every attempt is made to prevent procedures from being rescheduled.  Due to the nauture of Electrophysiology, rescheduling can happen.  The physician is always aware and directs the staff when this occurs.

## 2010-06-10 NOTE — Assessment & Plan Note (Signed)
Summary: 1 MO F/U PER 6/2 OV-JM   Visit Type:  Follow-up Primary Provider:  Dr. Erasmo Downer   History of Present Illness: 60 year old woman presents for followup. I recently saw her in June. Norvasc was started at the last visit for better blood pressure control. She reports tolerating this medicine well, her blood pressure looks much better today.  She denies any progressive lower extremity edema, and reports NYHA class II dyspnea on exertion. No anginal chest pain. She is very happy with her present symptom control, on limited medical therapy.  She states that she now has transtelephonic monitoring for her ICD, followed by Dr. Johney Frame.  Preventive Screening-Counseling & Management  Alcohol-Tobacco     Smoking Status: quit     Year Quit: 2007  Current Medications (verified): 1)  Lipitor 20 Mg Tabs (Atorvastatin Calcium) .... Take 1 Tablet By Mouth At Bedtime 2)  Mirapex 1.5 Mg Tabs (Pramipexole Dihydrochloride) .... Take 1 Tablet By Mouth Once A Day 3)  Amlodipine Besylate 5 Mg Tabs (Amlodipine Besylate) .... Take One Tablet By Mouth Daily 4)  Zegerid 20-1100 Mg Caps (Omeprazole-Sodium Bicarbonate) .... Take 1 Tablet By Mouth Once A Day  Allergies (verified): 1)  ! Pcn 2)  ! * Esgesic 3)  ! Nsaids 4)  ! * Latex  Comments:  Nurse/Medical Assistant: The patient's medication bottles and allergies were reviewed with the patient and were updated in the Medication and Allergy Lists.  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  Lives in [place].  Divorced Tobacco Use - quit 2007, prior 3-4 PPD Alcohol Use  - remote heavy Drug Use - marijuana  Review of Systems  The patient denies anorexia, weight loss, chest pain, syncope, peripheral edema, melena, and hematochezia.         Otherwise reviewed and negative.  Vital Signs:  Patient profile:   60 year old female Height:      62 inches Weight:      205 pounds Pulse rate:   64 / minute BP sitting:   128 / 81  (left arm) Cuff size:   large  Vitals Entered By: Carlye Grippe (November 15, 2009 1:22 PM)  Physical Exam  Additional Exam:  Overweight woman, in no acute distress. HEENT: Conjunctiva and lids normal, oropharynx with poor dentition. Neck: Supple, increased girth, no bruits or obvious elevation in JVP. Lungs: Diminished breath sounds, nonlabored. Cardiac: Regular rate and rhythm, indistinct PMI, no S3. Abdomen: Obese, soft, bowel sounds present. Extremities: No pitting edema, distal pulses one plus.   EKG  Procedure date:  11/15/2009  Findings:      Atrial paced rhythm at 85 beats per minute with evidence of previous inferior wall infarct, nonspecific ST-T changes.   ICD Specifications Following MD:  Hillis Range, MD     ICD Vendor:  Medtronic     ICD Model Number:  [id number]     ICD Serial Number:  [id number] ICD DOI:  03/29/2009     ICD Implanting MD:  Hillis Range, MD  Lead 1:    Location: RA     DOI: 03/29/2009     Model #: [id number]     Serial #: [id number]  Status: active Lead 2:    Location: RV     DOI: 03/29/2009     Model #: [id number]     Serial #: [id number]     Status: active  Indications::  CM   ICD Follow Up ICD Dependent:  No      Episodes Coumadin:  No  Brady Parameters Mode DDDR+     Lower Rate Limit:  60     Upper Rate Limit 130 PAV 180     Sensed AV Delay:  150  Tachy Zones VF:  200     Impression & Recommendations:  Problem # 1:  ESSENTIAL HYPERTENSION, BENIGN (ICD-401.1)  Blood pressure better controlled following addition of Norvasc. Patient continues to prefer minimal medications. I do plan a  followup BMET and BNP in light of her prior laboratory abnormalities. No new medicines were added today. Followup in 2 months.  Her updated medication list for this problem includes:    Amlodipine Besylate 5 Mg Tabs (Amlodipine besylate) .Marland Kitchen... Take one tablet by mouth daily  Problem # 2:  CHRONIC SYSTOLIC HEART FAILURE (ICD-428.22)  Patient appears euvolemic, and reports NYHA class II dyspnea and exertion. Her weight is stable.  Her updated medication list for this problem includes:    Amlodipine Besylate 5 Mg Tabs (Amlodipine besylate) .Marland Kitchen... Take one tablet by mouth daily  Other Orders: EKG w/ Interpretation (93000) T-Basic Metabolic Panel (86578-46962) T-BNP  (B Natriuretic Peptide) (95284-13244)  Patient Instructions: 1)  Follow up with Dr. Diona Browner on Friday, January 10, 2010 at 1pm. 2)  Your physician recommends that you go to the [facility] for lab work. 3)  Your physician recommends that you continue on your current medications as directed. Please refer to the Current Medication list given to you today.

## 2010-06-10 NOTE — Letter (Signed)
Summary: [facility] D/C DR. ANWAR  [facility] D/C DR. ANWAR   Imported By: Zachary George 01/10/2010 08:50:26  _____________________________________________________________________  External Attachment:    Type:   Image     Comment:   External Document

## 2010-06-10 NOTE — Progress Notes (Signed)
Summary: pt needs to cancel friday  Phone Note Call from Patient Call back at Home Phone [phone]   Caller: Patient Reason for Call: Talk to Nurse, Talk to Doctor Summary of Call: pt needs to cancel procedure for friday because she has no ride and wants to rsc to later date.  She will call and let us know when she can come  Dr Johney Frame aware Dennis Bast, RN, BSN  March 19, 2010 2:04 PM Initial call taken by: Omer Jack,  March 19, 2010 12:14 PM  Follow-up for Phone Call        Cant come has no ride from [place].  Will call to reschedule

## 2010-06-10 NOTE — Progress Notes (Signed)
Summary: re procedure reschedule/ calling back  Phone Note Call from Patient Call back at Home Phone [phone]   Caller: Patient Reason for Call: Talk to Nurse Summary of Call: pt would to talk to nurse re DFT's procedure. pt would like to reschedule and has question what she needs to do before procedure. Initial call taken by: Roe Coombs,  March 20, 2010 10:54 AM  Follow-up for Phone Call        lmom for pt to call me back to reschedule procedure Dennis Bast, RN, BSN  March 20, 2010 6:22 PM  Additional Follow-up for Phone Call Additional follow up Details #1::        per pt calling back to speak with kelly . [phone] Lorne Skeens  March 24, 2010 9:13 AM  Will need to be at hospital at 10:30 for a 12:30 procedure on 04/01/10  pt aware  Spoke w Short Stay they have all paper work and will mark to have labs ordered STAT on arrival Dennis Bast, RN, BSN  March 24, 2010 9:35 AM

## 2010-06-10 NOTE — Assessment & Plan Note (Signed)
Summary: pacer ck/mt   Visit Type:  Follow-up Referring Provider:  Dr Diona Browner Primary Provider:  Dr. Erasmo Downer   History of Present Illness: The patient presents today for routine electrophysiology followup. She reports doing reasonably well since last being seen in our clinic. The patient denies symptoms of palpitations, chest pain, shortness of breath, orthopnea, PND, lower extremity edema, dizziness, presyncope, syncope, or neurologic sequela. The patient is tolerating medications without difficulties and is otherwise without complaint today.   Current Medications (verified): 1)  Zegerid 20-1100 Mg Caps (Omeprazole-Sodium Bicarbonate) .... Take 1 Tablet By Mouth Once A Day 2)  Mirapex 1.5 Mg Tabs (Pramipexole Dihydrochloride) .... Take 1 Tablet By Mouth Once A Day 3)  Lipitor 20 Mg Tabs (Atorvastatin Calcium) .... Take 1 Tablet By Mouth At Bedtime 4)  Amlodipine Besylate 5 Mg Tabs (Amlodipine Besylate) .... Take One Tablet By Mouth Daily 5)  Advair Diskus 250-50 Mcg/dose Aepb (Fluticasone-Salmeterol) .... Uad 6)  Novolog 100 Unit/ml Soln (Insulin Aspart) .... Uad 7)  Lantus 100 Unit/ml Soln (Insulin Glargine) .... Uad 8)  Bumetanide 2 Mg Tabs (Bumetanide) .... Once Daily 9)  Spironolactone 25 Mg Tabs (Spironolactone) .... Two Times A Day 10)  Carvedilol 12.5 Mg Tabs (Carvedilol) .... Take One Tablet By Mouth Twice A Day 11)  Metformin Hcl 500 Mg Tabs (Metformin Hcl) .... Two Times A Day  Allergies: 1)  ! Pcn 2)  ! * Esgesic 3)  ! Nsaids 4)  ! * Latex 5)  ! * Tape  Past History:  Past Medical History: Reviewed history from 09/06/2009 and no changes required. 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 Carpel tunnel Abdominal Hysterectomy-Total CABG, April 2009, LIMA to LAD, SVG to ramus, SVG to circumflex marginal, SVG to posterior descending Sternal incision revision February 2010  Family History: Reviewed history from 03/18/2009 and no changes required. Coronary Artery Disease  Social History: Reviewed history from 03/18/2009 and no changes required. Disabled.  Lives in [place].  Divorced Tobacco Use - quit 2007, prior 3-4 PPD Alcohol Use - remote heavy Drug Use - marijuana  Review of Systems       All systems are reviewed and negative except as listed in the HPI.   Vital Signs:  Patient profile:   60 year old female Height:      62 inches Weight:      213 pounds BMI:     39.10 O2 Sat:      97 % Pulse rate:   65 / minute BP sitting:   110 / 60  (left arm)  Vitals Entered By: Laurance Flatten CMA (February 07, 2010 11:53 AM)  Physical Exam  General:  chronically ill, NAD Head:  normocephalic and atraumatic Eyes:  PERRLA/EOM intact; conjunctiva and lids normal. Neck:  supple Chest Wall:  ICD pocket is well healed Lungs:  Clear bilaterally to auscultation and percussion. Heart:  RRR, no m/r/g Abdomen:  Bowel sounds positive; abdomen soft and non-tender without masses, organomegaly, or hernias noted. No hepatosplenomegaly. Msk:  Back normal, normal gait. Muscle strength and tone normal. Pulses:  pulses normal in all 4 extremities Extremities:  No clubbing or cyanosis.  no edema Neurologic:  Alert and oriented x 3.  nonfocal    ICD Specifications Following MD:  Hillis Range, MD     ICD Vendor:  Medtronic     ICD Model Number:  [id number]     ICD Serial Number:  [id number] ICD DOI:  03/29/2009     ICD Implanting MD:  Hillis Range, MD  Lead 1:    Location: RA     DOI: 03/29/2009     Model #: [id number]     Serial #: [id number]     Status: active Lead 2:    Location: RV     DOI: 03/29/2009     Model #:  6213     Serial #: [id number]     Status: active  Indications::  CM   ICD Follow Up Battery Voltage:  3.15 V     Charge Time:  8.7 seconds     Underlying rhythm:  SR ICD Dependent:  No       ICD Device Measurements Atrium:  Amplitude: 2.1 mV, Impedance: 475 ohms, Threshold: 0.75 V at 0.40 msec Right Ventricle:  Amplitude: 3.6 mV, Impedance: 418 ohms, Threshold: 2.00 V at 0.30 msec Shock Impedance: 64 ohms   Episodes MS Episodes:  0     Coumadin:  No Shock:  0     ATP:  0     Nonsustained:  0     Atrial Therapies:  0 Atrial Pacing:  95.1%     Ventricular Pacing:  <0.1%  Brady Parameters Mode MVP     Lower Rate Limit:  60     Upper Rate Limit 130 PAV 180     Sensed AV Delay:  150  Tachy Zones VF:  200     Next Remote Date:  05/08/2010     Tech Comments:  CARELINK CHECK SHOWED RWAVES DECREASING.  TODAYS CHECK RWAVES 3.93mV.  RV THRESHOLD (PW) 2.00 @ 0.28ms--CHANGED RV OUTPUT FROM 2.00 @ 0.34ms. Vella Kohler  February 07, 2010 12:29 PM MD Comments:  agree given low R waves, I will plan DFT testing to evaluate device sensing.  If she has adequate sensing, then we will follow, however if she does not appropriately sense VF then we will plan RV lead revision  Impression & Recommendations:  Problem # 1:  CHRONIC SYSTOLIC HEART FAILURE (ICD-428.22) stable presently optivolemic  Problem # 2:  ISCHEMIC CARDIOMYOPATHY (ICD-414.8) s/p ICD pt has low R waves by ICD interrogation, likely due to scarring over the tip of the lead.   I had a long discussion with the patient and her daughter today.  We discussed DFT testing to evaluate the ability to sense VF.  IF adequate sensing, then we will simply monitor.  IF she has inadequate sensing of VF, then we will plan to add a pace/sense lease.  Risks, benefits, alternatives to ICD system tesing with possible RV lead revision were discussed in detail with the patient today. She understands that the risks include but are not limited to bleeding,  infection, pneumothorax, perforation, tamponade, vascular damage, renal failure, MI, stroke, death, inappropriate shocks, and lead dislodgement.  She accepts these risks and wishes to proceed.  Patient Instructions: 1)  Come to office on 03/14/10 at 2pm for labs and to pick up inctructions for 11/11/11procedure at hospital

## 2010-06-10 NOTE — Assessment & Plan Note (Signed)
Summary: 1 MONTH F/U PER 4/29 OV-JM   Visit Type:  Follow-up Primary Provider:  Dr. Erasmo Downer   History of Present Illness: 60 year old woman presents for followup. She states that she has seen Dr. Linna Darner since her last visit, and continues on minimal if any medical therapy. This remains her preference. She states that she is not checking her blood sugars and does not want to take any medications for diabetes.  Followup labs from 5 May revealed BUN 28, creatinine 1.5, AST 19, ALT 17, triglycerides 94, total cholesterol 183, HDL 60, LDL 104, potassium 5.6, sodium 131, TSH 2.4, hemoglobin 11.1, platelets 208. We discussed these today, and the implications.  She still insists that she feels much better, without any progressive shortness of breath, no chest pain, no palpitations.  Preventive Screening-Counseling & Management  Alcohol-Tobacco     Smoking Status: quit     Year Quit: 2007  Current Medications (verified): 1)  Lipitor 20 Mg Tabs (Atorvastatin Calcium) .... Take 1 Tablet By Mouth At Bedtime 2)  Ambien 10 Mg Tabs (Zolpidem Tartrate) .... At Bedtime 3)  Pramipexole Dihydrochloride 0.25 Mg Tabs (Pramipexole Dihydrochloride) .... Take 1 Tablet By Mouth Two Times A Day 4)  Hydrocodone-Acetaminophen 5-500 Mg Tabs (Hydrocodone-Acetaminophen) .... Take 1 Tablet By Mouth Two Times A Day  Allergies (verified): 1)  ! Pcn 2)  ! * Esgesic 3)  ! Nsaids 4)  ! * Latex  Comments:  Nurse/Medical Assistant: The patient is currently on medications but does not know the name or dosage at this time. Instructed to contact our office with details. Will update medication list at that time.  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  Lives in [place].  Divorced Tobacco Use - quit 2007, prior 3-4 PPD Alcohol Use - remote heavy Drug Use - marijuana  Review of Systems  The patient denies anorexia, fever, chest pain, syncope, prolonged cough, headaches, melena, and hematochezia.         Otherwise reviewed and negative.  Vital Signs:  Patient profile:   60 year old female Height:      62 inches Weight:      205 pounds Pulse rate:   73 / minute BP sitting:   152 / 90  (right arm) Cuff size:   large  Vitals Entered By: Carlye Grippe (October 10, 2009 9:30 AM)  Physical Exam  Additional Exam:  Overweight, chronically ill-appearing woman, in no acute distress. HEENT: Conjunctiva and lids normal, oropharynx with poor dentition. Neck: Supple, increased girth, no bruits or obvious elevation in JVP. Lungs: Diminished breath sounds, nonlabored. Cardiac: Regular rate and rhythm, indistinct PMI, no S3. Abdomen: Obese, soft, bowel sounds present. Extremities: No pitting edema, distal pulses one plus. Skin: Warm and dry. Musculoskeletal: No kyphosis. Neuropsychiatric: Alert and oriented x3, affect grossly appropriate.   EKG  Procedure date:  10/10/2009  Findings:      Atrial paced rhythm at 78 beats per minute with evidence of inferolateral infarct.   ICD  Specifications Following MD:  Hillis Range, MD     ICD Vendor:  Medtronic     ICD Model Number:  [id number]     ICD Serial Number:  [id number] ICD DOI:  03/29/2009     ICD Implanting MD:  Hillis Range, MD  Lead 1:    Location: RA     DOI: 03/29/2009     Model #: 4098     Serial #: [id number]     Status: active Lead  2:    Location: RV     DOI: 03/29/2009     Model #: 6213     Serial #: [id number]     Status: active  Indications::  CM   ICD Follow Up ICD Dependent:  No      Episodes Coumadin:  No  Brady Parameters Mode DDDR+     Lower Rate Limit:  60     Upper Rate Limit 130 PAV 180     Sensed AV Delay:  150  Tachy Zones VF:  200     Impression & Recommendations:  Problem # 1:  ESSENTIAL HYPERTENSION, BENIGN (ICD-401.1)  Blood pressure is not controlled. I have reviewed this with the patient on a number of occasions. She is willing to consider taking a medicine to address this, and we will begin Norvasc at this point. I am specifically avoiding ACE inhibitor or angiotensin receptor blocker therapy in this case in light of her documentation of renal insufficiency and hyperkalemia. I will have her return over the next month and can consider followup labs around that time.  Her updated medication list for this problem includes:    Amlodipine Besylate 5 Mg Tabs (Amlodipine besylate) .Marland Kitchen... Take one tablet by mouth daily  Problem # 2:  HYPERLIPIDEMIA-MIXED (ICD-272.4)  She reports compliance with her Lipitor. LDL was not clearly at goal, however.  We discussed this, and will continue to follow at this point, perhaps increasing her Lipitor at a later date if she agrees.  Her updated medication list for this problem includes:    Lipitor 20 Mg Tabs (Atorvastatin calcium) .Marland Kitchen... Take 1 tablet by mouth at bedtime  Problem # 3:  ISCHEMIC CARDIOMYOPATHY (ICD-414.8)  Patient denies any active angina and has stable NYHA class II dyspnea on exertion.  Her updated medication list for this problem includes:    Amlodipine Besylate 5 Mg Tabs (Amlodipine besylate) .Marland Kitchen... Take one tablet by mouth daily  Other Orders: EKG w/ Interpretation (93000)  Patient Instructions: 1)  Start Norvasc (amlodipine) 5mg  by mouth once daily. 2)  Return for office visit with Dr. Diona Browner on Friday, November 15, 2009 at  1:20pm. Prescriptions: AMLODIPINE BESYLATE 5 MG TABS (AMLODIPINE BESYLATE) Take one tablet by mouth daily  #30 x 6   Entered by:   Cyril Loosen, RN, BSN   Authorized by:   Loreli Slot, MD, [facility]   Signed by:   Cyril Loosen, RN, BSN on 10/10/2009   Method used:   Electronically to        [facility]* (retail)       [address]       [place], [place]  [address]       Ph: [phone]       Fax: [phone]   RxID:   [id number]   Handout requested.

## 2010-06-12 NOTE — Cardiovascular Report (Signed)
Summary: Office Visit Remote   Office Visit Remote   Imported By: Roderic Ovens 05/23/2010 10:56:00  _____________________________________________________________________  External Attachment:    Type:   Image     Comment:   External Document

## 2010-06-12 NOTE — Letter (Signed)
Summary: Remote Device Check  [facility], Main Office  [address]   [place], [place] [address]   Phone: [phone]  Fax: [phone]     May 21, 2010 MRN: [id number]   [facility] [address] [place], [place]  [address]   Dear Ms. Bye,   Your remote transmission was recieved and reviewed by your physician.  All diagnostics were within normal limits for you.   __X____Your next office visit is scheduled for: March 2012 with Dr Johney Frame in [place] office. Please call our office to schedule an appointment.    Sincerely,  Vella Kohler

## 2010-06-12 NOTE — Assessment & Plan Note (Signed)
Summary: eph-[facility]   Referring Provider:  Dr Diona Browner Primary Provider:  Dr. Erasmo Downer   History of Present Illness: The patient presents today for routine electrophysiology followup.  She reports mild increasing SOB and weight gain over the past week.  She also has been itching due to a rash seconday to hydrocodone.  Unfortunately, she has begun smoking again. She denies symptoms of palpitations, chest pain, lower extremity edema (above baseline), dizziness, presyncope, syncope, or neurologic sequela. The patient is tolerating medications without difficulties and is otherwise without complaint today.   Current Medications (verified): 1)  Zegerid 20-1100 Mg Caps (Omeprazole-Sodium Bicarbonate) .... Take 1 Tablet By Mouth Once A Day 2)  Mirapex 1.5 Mg Tabs (Pramipexole Dihydrochloride) .... Take 1 Tablet By Mouth Once A Day 3)  Lipitor 20 Mg Tabs (Atorvastatin Calcium) .... Take 1 Tablet By Mouth At Bedtime 4)  Amlodipine Besylate 5 Mg Tabs (Amlodipine Besylate) .... Take One Tablet By Mouth Daily 5)  Advair Diskus 250-50 Mcg/dose Aepb (Fluticasone-Salmeterol) .... Uad 6)  Lantus 100 Unit/ml Soln (Insulin Glargine) .... Uad 7)  Bumetanide 2 Mg Tabs (Bumetanide) .... Once Daily 8)  Spironolactone 25 Mg Tabs (Spironolactone) .... Two Times A Day 9)  Carvedilol 12.5 Mg Tabs (Carvedilol) .... Take One Tablet By Mouth Twice A Day 10)  Metformin Hcl 500 Mg Tabs (Metformin Hcl) .... Two Times A Day  Allergies: 1)  ! Pcn 2)  ! * Esgesic 3)  ! Nsaids 4)  ! * Latex 5)  ! * Tape 6)  ! Hydrocodone  Past History:  Past Medical History: Reviewed history from 09/06/2009 and no changes required. 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 Carpel tunnel Abdominal Hysterectomy-Total CABG, April 2009, LIMA to LAD, SVG to ramus, SVG to circumflex marginal, SVG to posterior descending Sternal incision revision February 2010  Social History: Reviewed history from 03/18/2009 and no changes required. Disabled.  Lives in [place].  Divorced Tobacco Use - quit 2007, prior 3-4 PPD Alcohol Use - remote heavy Drug Use - marijuana  Review of Systems       All systems are reviewed and negative except as listed in the HPI.   Vital Signs:  Patient profile:   60 year old female Height:      62 inches Weight:      206 pounds Pulse rate:   60 / minute BP sitting:   107 / 69  (left arm) Cuff size:   large  Vitals Entered By: Fuller Plan CMA (June 02, 2010 3:23 PM)  Physical Exam  General:  chronically ill appearing Head:  normocephalic and atraumatic Eyes:  PERRLA/EOM intact; conjunctiva and lids normal. Mouth:  Teeth, gums and palate normal. Oral mucosa normal. Neck:  supple, JVP 9cm Chest Wall:  ICD pocket is well healed Lungs:  CTAB Heart:  RRR, no m/r/g Abdomen:  Bowel sounds positive; abdomen soft and non-tender without masses, organomegaly, or hernias noted. No hepatosplenomegaly. Msk:  she appears to have mild psychomotor aggitation today Extremities:   trace edema Neurologic:  Alert and oriented x 3.  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    ICD Specifications Following MD:  Hillis Range, MD     ICD Vendor:  Medtronic     ICD Model Number:  [id number]     ICD Serial Number:  [id number] ICD DOI:  03/29/2009     ICD Implanting MD:  Hillis Range, MD  Lead 1:    Location: RA     DOI: 03/29/2009     Model #: [id number]     Serial #: [id number]     Status: active Lead 2:    Location: RV     DOI: 03/29/2009     Model #: [id number]     Serial #: [id number]     Status:  active  Indications::  CM   ICD Follow Up Battery Voltage:  3.14 V     Charge Time:  9.0 seconds     Underlying rhythm:  SB @ 48 ICD Dependent:  No       ICD Device Measurements Atrium:  Amplitude: 2.9 mV, Impedance: 475 ohms, Threshold: 0.75 V at 0.40 msec Right Ventricle:  Amplitude: 3.8 mV, Impedance: 418 ohms, Threshold: 1.00 V at 0.90 msec Shock Impedance: 62 ohms   Episodes MS Episodes:  0     Coumadin:  No Shock:  0     ATP:  0     Nonsustained:  0     Atrial Therapies:  0 Atrial Pacing:  80.3%     Ventricular Pacing:  0.2%  Brady Parameters Mode MVP     Lower Rate Limit:  60     Upper Rate Limit 130 PAV 180     Sensed AV Delay:  150  Tachy Zones VF:  200     Next Remote Date:  09/04/2010     Next Cardiology Appt Due:  05/12/2011 Tech Comments:  OPTIVOL INCREASE FROM 1-5 AND ONGOING.  PER PT WT INCREASED 4 LBS FROM YESTERDAY.  NO SOB.  NORMAL DEVICE FUNCTION.  CHANGED RV OUTPUT FROM 2.0 TO 2.5 V. DEMONSTRATED TONE FOR PT CARELINK 09-04-10 AND ROV IN 12 MTHS W/JA IN [place]. Vella Kohler  June 02, 2010 2:54 PM  MD Comments:  agree  Impression & Recommendations:  Problem # 1:  CHRONIC SYSTOLIC HEART FAILURE (ICD-428.22) The patient appears volume overloaded today.  Optivol is up. Salt restriction increase bumex to 2mg  two times a day x 2 days, then return to daily we will check BMET today follow-up with Dr Diona Browner in next few weeks   normal ICD function as above  Problem # 2:  ESSENTIAL HYPERTENSION, BENIGN (ICD-401.1) stable  Problem # 3:  ISCHEMIC CARDIOMYOPATHY (ICD-414.8) no symptoms of ischemia  Problem # 4:  RESTLESSNESS (ICD-307.9) She does not appear well today, almost appears to have increased toxic metabolites based on diffuse psychomotor aggitation we will check BMET, LFTs, TFTs, and CBC today.  She is instructed to follow-up with PCP.  Problem # 5:  CHRONIC KIDNEY DISEASE STAGE II (MILD) (ICD-585.2) check BMET today  Other Orders: T-Chest  x-ray, 2 views (71020) T-CBC No Diff ([id number]) T-Basic Metabolic Panel (44010-27253) T-Lipid Profile (66440-34742) T-TSH ([id number])  Patient Instructions: 1)  Follow up with Dr. Diona Browner on Thursday, July 03, 2010 at 3:40pm. 2)  Increase Bumex 2mg  to two times a day for 2 days. 3)  Your physician discussed the hazards of tobacco use.  Tobacco use cessation is recommended and techniques and options to help you quit were discussed. 4)  Your physician recommends that you go to the [facility] for lab work. Do not eat or drink after  midnight.  5)  A chest x-ray takes a picture of the organs and structures inside the chest, including the heart, lungs, and blood vessels. This test can show several things, including, whether the heart is enlarged; whether fluid is building up in the lungs; and whether pacemaker / defibrillator leads are still in place.  Appended Document: eph-[facility] smoking cessation is advised at this point, she is not interested in quiting.

## 2010-06-18 NOTE — Cardiovascular Report (Signed)
Summary: [facility]/ INTERROGATION QUICK LOOK  Card Device Clinic/ INTERROGATION QUICK LOOK   Imported By: Dorise Hiss 06/12/2010 16:55:03  _____________________________________________________________________  External Attachment:    Type:   Image     Comment:   External Document

## 2010-06-22 IMAGING — CT CT CERVICAL SPINE W/O CM
3 of 6 series · 10 of 33 positions shown, 12 images · non-contrast
Comparison: CT head 05/06/2009 at [facility] [[facility]OS[facility]].

CT HEAD

CLINICAL DATA: Fall.  Headache and neck pain.

CT HEAD WITHOUT CONTRAST
CT CERVICAL SPINE WITHOUT CONTRAST
TECHNIQUE: Multidetector CT imaging of the head and cervical spine
was performed following the standard protocol without intravenous
contrast.  Multiplanar CT image reconstructions of the cervical
spine were also generated.

[Series 3: headseq 2.4 h60s · axial · 0.44mm/px · z∈[+252,+302]mm · 2 of 60 slices shown, 3 images]
[im 20/60  soft-tissue]
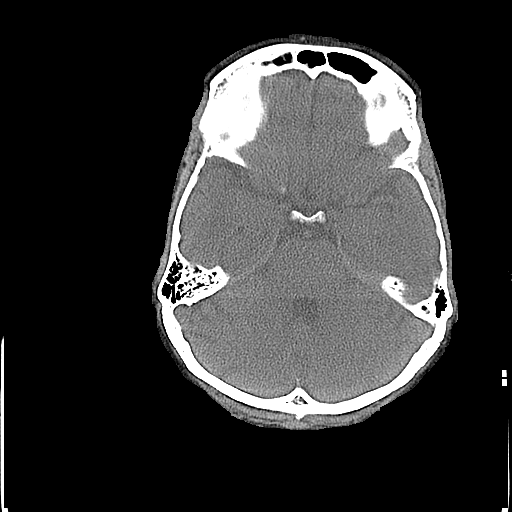
[im 20/60  bone]
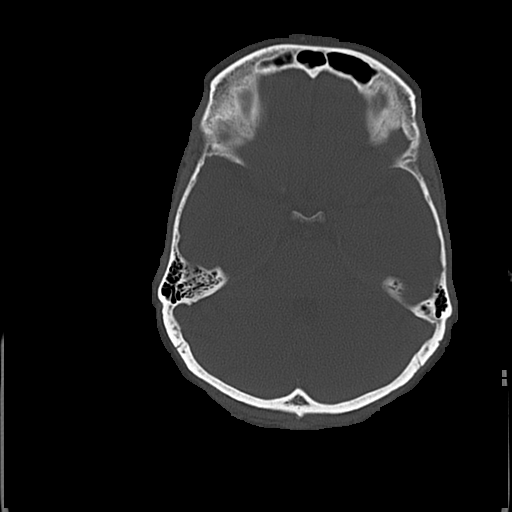
[im 40/60  bone]
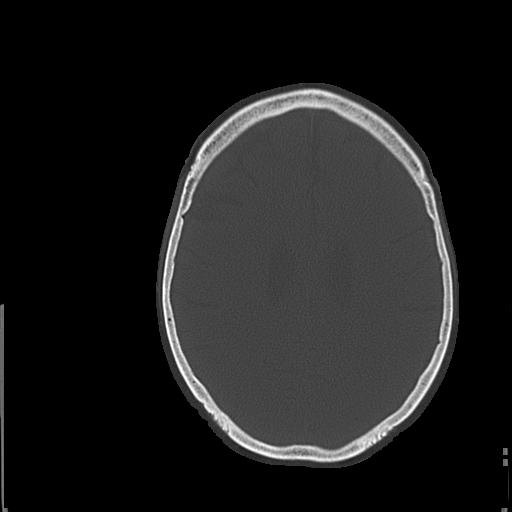

[Series 7: cervical coro (id) · coronal · 0.17mm/px · 3 of 35 slices shown]
[im 7/35  bone]
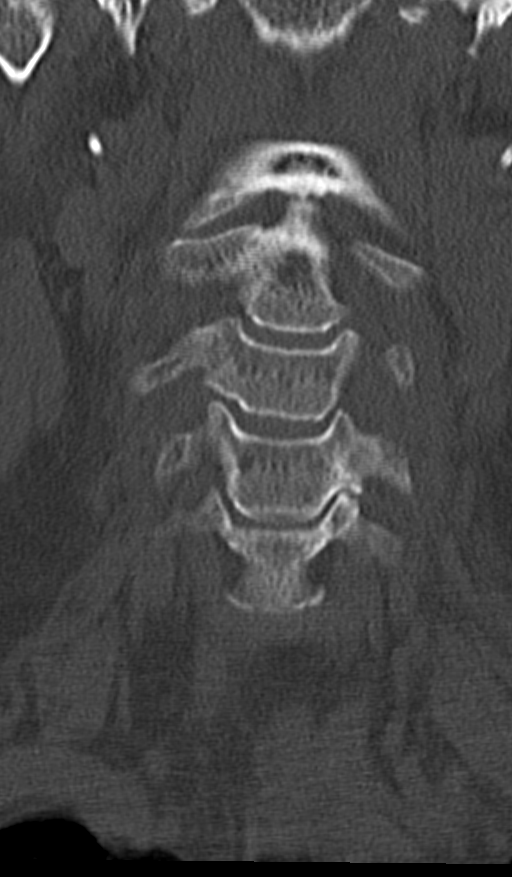
[im 14/35  bone]
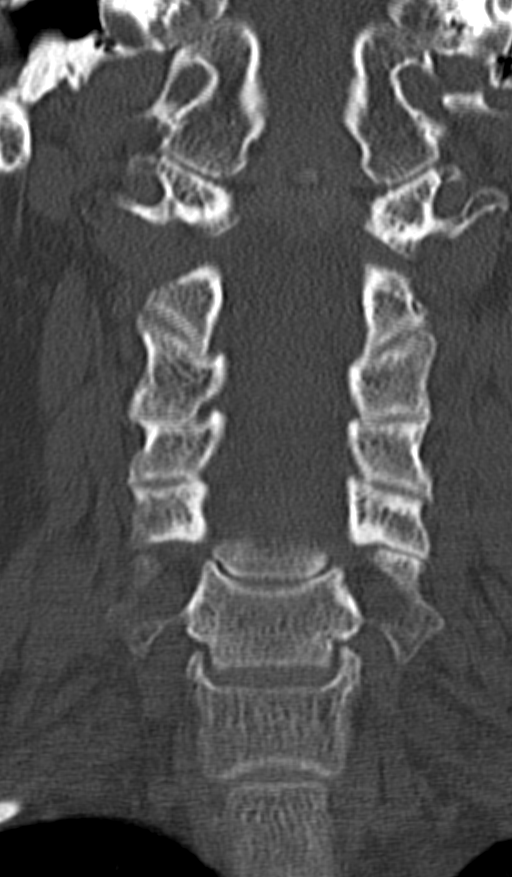
[im 21/35  bone]
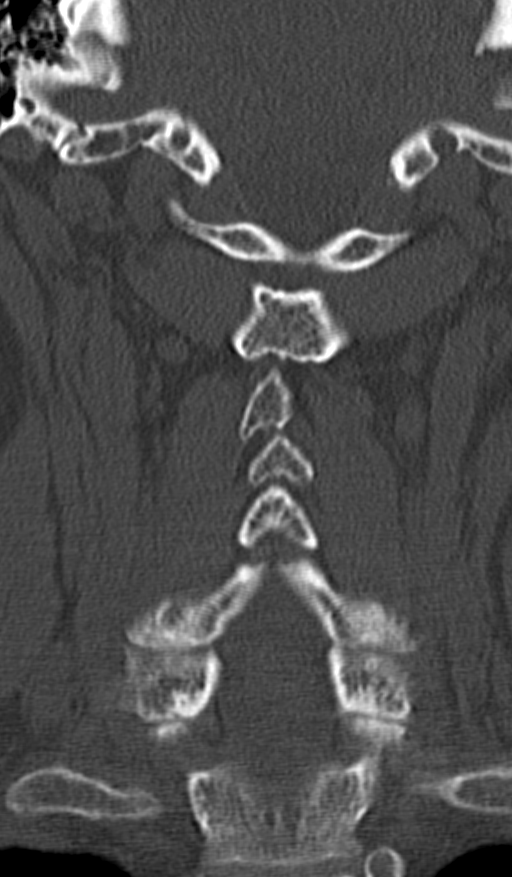

[Series 8: cervical sag (id) · sagittal · 0.17mm/px · 5 of 36 slices shown, 6 images]
[im 12/36  bone]
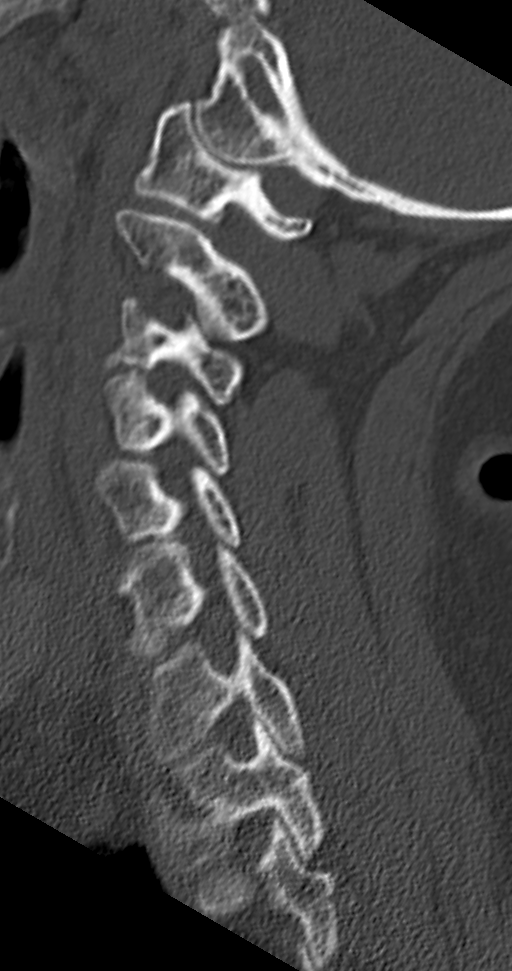
[im 15/36  bone]
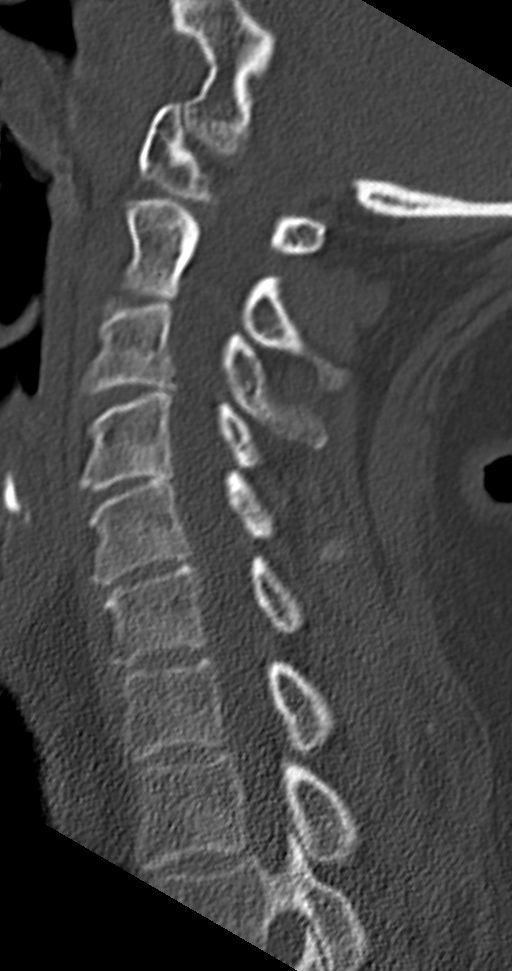
[im 18/36  soft-tissue]
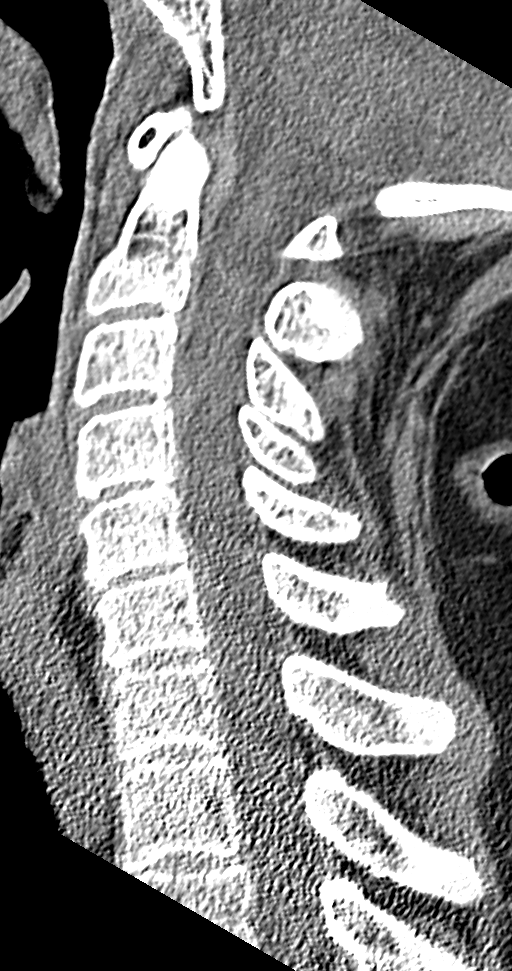
[im 18/36  bone]
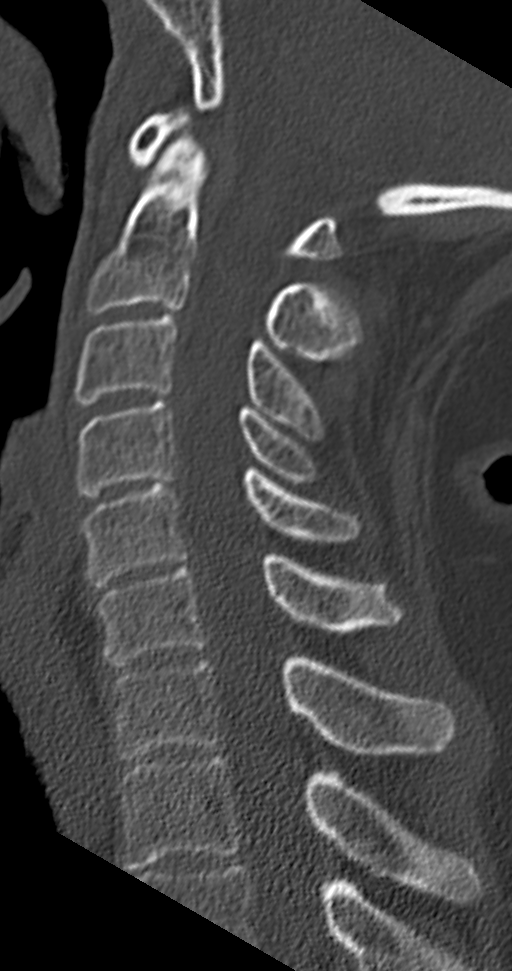
[im 21/36  bone]
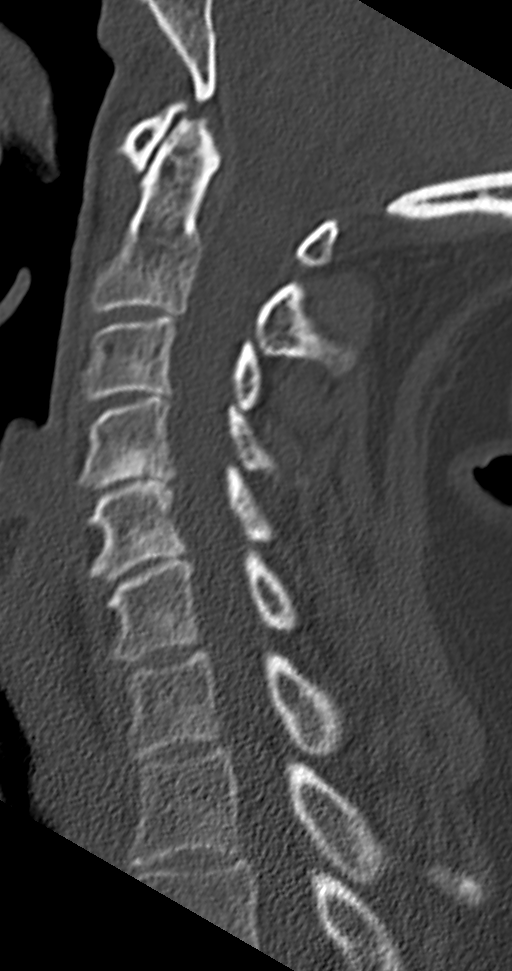
[im 24/36  bone]
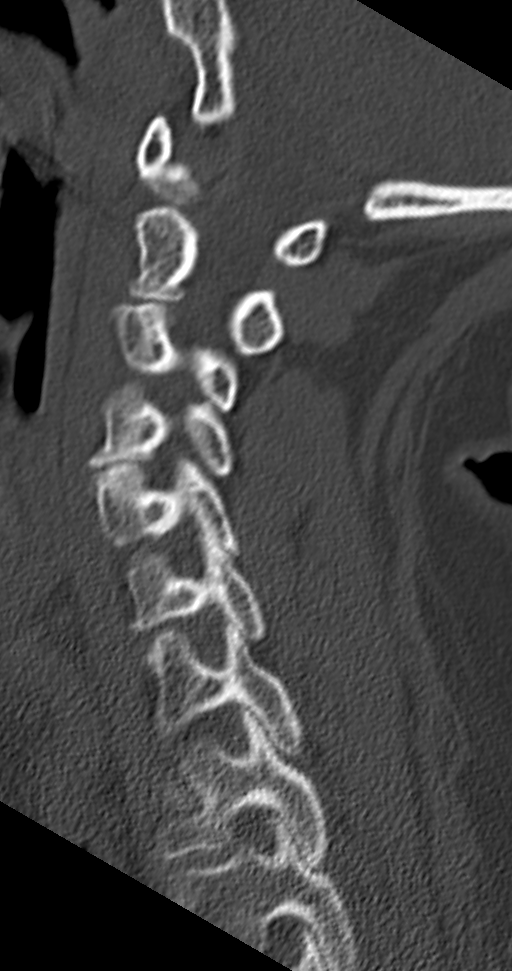

[10 of 33 positions shown; findings below may reference images not displayed]

FINDINGS: No acute intracranial abnormalities are present.  No
acute cortical infarct, hemorrhage, mass, hydrocephalus, or
significant extra-axial fluid collection is present.  A remote
lacunar infarct of the right putamen is stable.  Atherosclerotic
calcifications are noted in the cavernous carotid arteries
bilaterally.  The paranasal sinuses and mastoid air cells are
clear.  The osseous skull is intact.
IMPRESSION: 1.  No acute intracranial abnormality or significant interval
change.
2.  Stable remote lacunar infarct of the right putamen.

CT CERVICAL SPINE
FINDINGS: The cervical spine is imaged from the skull base through
the midbody of T2.  There is some patient motion.  No acute
fracture or traumatic subluxation is evident.  Endplate
degenerative changes and uncovertebral disease are worse left than
right at C4-5 and C5-6.  The lung apices are clear.  The soft
tissues are unremarkable.
IMPRESSION: 1.  No acute fracture or traumatic subluxation.
2.  Mild spondylosis of the mid cervical spine, slightly worse on
the left.

## 2010-07-03 ENCOUNTER — Encounter: Payer: Self-pay | Admitting: Cardiology

## 2010-07-03 ENCOUNTER — Ambulatory Visit (INDEPENDENT_AMBULATORY_CARE_PROVIDER_SITE_OTHER): Payer: Medicare Other | Admitting: Cardiology

## 2010-07-03 DIAGNOSIS — E782 Mixed hyperlipidemia: Secondary | ICD-10-CM

## 2010-07-03 DIAGNOSIS — I2510 Atherosclerotic heart disease of native coronary artery without angina pectoris: Secondary | ICD-10-CM

## 2010-07-03 DIAGNOSIS — I2589 Other forms of chronic ischemic heart disease: Secondary | ICD-10-CM

## 2010-07-03 DIAGNOSIS — I10 Essential (primary) hypertension: Secondary | ICD-10-CM

## 2010-07-08 NOTE — Assessment & Plan Note (Signed)
Summary: FOLLOW UP PER DR. ALLRED-JM   Visit Type:  Follow-up Referring Provider:  Dr. Hillis Range Primary Provider:  Dr. Lia Hopping   History of Present Illness: 60 year old presents for followup. She was most recently seen by Dr. Johney Frame in January. I note that she underwent ICD interrogation with reprogramming of the fibrillation threshold with Dr. Johney Frame in November 2011. She was noted to have successful sensing of VF and no lead changes were required.  Followup labs from 24 January showed BUN 13, creatinine 1.3, cholesterol 175, triglycerides 97, HDL 48, LDL 108, sodium 141, potassium 4.2, TSH 5.3, hemoglobin 10.7, platelets 343. She saw Dr. Olena Leatherwood this morning and they continue to work on glucose control.  She reports compliance with medications, list has grown since I last saw her. Blood pressure is much better controlled today.  She denies any obvious angina. Reports being under stress at home. Has NYHA class 2-3 dyspnea exertion, stable overall.  Preventive Screening-Counseling & Management  Alcohol-Tobacco     Smoking Status: current     Smoking Cessation Counseling: yes     Packs/Day: 1/4 PPD  Current Medications (verified): 1)  Zegerid 20-1100 Mg Caps (Omeprazole-Sodium Bicarbonate) .... Take 1 Tablet By Mouth Once A Day 2)  Mirapex 1.5 Mg Tabs (Pramipexole Dihydrochloride) .... Take 1 Tablet By Mouth Once A Day 3)  Lipitor 20 Mg Tabs (Atorvastatin Calcium) .... Take 1 Tablet By Mouth At Bedtime 4)  Amlodipine Besylate 5 Mg Tabs (Amlodipine Besylate) .... Take One Tablet By Mouth Daily 5)  Advair Diskus 250-50 Mcg/dose Aepb (Fluticasone-Salmeterol) .... Uad 6)  Lantus 100 Unit/ml Soln (Insulin Glargine) .... Uad 7)  Bumetanide 2 Mg Tabs (Bumetanide) .... Once Daily 8)  Spironolactone 25 Mg Tabs (Spironolactone) .... Two Times A Day 9)  Carvedilol 12.5 Mg Tabs (Carvedilol) .... Take One Tablet By Mouth Twice A Day 10)  Metformin Hcl 500 Mg Tabs (Metformin Hcl) .... Two  Times A Day 11)  Benadryl 25 Mg Tabs (Diphenhydramine Hcl) .... As Needed 12)  Tylenol Extra Strength 500 Mg Tabs (Acetaminophen) .... As Needed 13)  Melatonin 3 Mg Tabs (Melatonin) .... 3 By Mouth As Needed 14)  Gabapentin 300 Mg Caps (Gabapentin) .... Take 1 Tablet By Mouth Three Times A Day 15)  Valacyclovir Hcl 1 Gm Tabs (Valacyclovir Hcl) .... Take 1 Tablet By Mouth Three Times A Day 16)  Tramadol Hcl 50 Mg Tabs (Tramadol Hcl) .... Take 1 Tablet By Mouth Three Times A Day 17)  Alprazolam 0.25 Mg Tabs (Alprazolam) .... Take 1 Tablet By Mouth Two Times A Day 18)  Hydroxyzine Pamoate 25 Mg Caps (Hydroxyzine Pamoate) .... Take 1 Tablet By Mouth Three Times A Day  Allergies (verified): 1)  ! Pcn 2)  ! * Esgesic 3)  ! Nsaids 4)  ! * Latex 5)  ! * Tape 6)  ! Hydrocodone  Comments:  Nurse/Medical Assistant: The patient's medication list and allergies were reviewed with the patient and were updated in the Medication and Allergy Lists.  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  Lives in [place].  Divorced Tobacco Use - quit 2007, prior 3-4 PPD Alcohol Use - remote heavy Drug Use - marijuana  Social History: Smoking Status:  current Packs/Day:  1/4 PPD  Review of Systems       The patient complains of dyspnea on exertion.  The patient denies anorexia, fever, weight gain,  chest pain, syncope, peripheral edema, prolonged cough, abdominal pain, melena, and hematochezia.         Recent episode of shingles. Otherwise reviewed and negative except as outlined.  Vital Signs:  Patient profile:   60 year old female Height:      62 inches Weight:      188 pounds Pulse rate:   62 / minute BP sitting:   116 / 72  (left arm) Cuff size:   large  Vitals Entered By: Carlye Grippe (July 03, 2010 3:53 PM)  Physical Exam  Additional Exam:  Overweight woman, in no acute distress. Has lost weight since last visit. HEENT: Conjunctiva and lids normal, oropharynx with poor dentition. Neck: Supple, increased girth, no bruits or obvious elevation in JVP. Lungs: Diminished breath sounds, nonlabored. Cardiac: Regular rate and rhythm, indistinct PMI, no S3. Abdomen: Obese, soft, bowel sounds present. Extremities: No pitting edema, distal pulses one plus. Skin: Warm and dry. Rash on abdomen. Musculoskeletal: No kyphosis. Neuropsychiatric: Alert and oriented x3, affect appropriate.   EKG  Procedure date:  07/03/2010  Findings:      Atrial paced rhythm at 83 beats per minute, evidence of previous inferior wall infarct.   ICD Specifications Following MD:  Hillis Range, MD     ICD Vendor:  Medtronic     ICD Model Number:  [id number]     ICD Serial Number:  [id number] ICD DOI:  03/29/2009     ICD Implanting MD:  Hillis Range, MD  Lead 1:    Location: RA     DOI: 03/29/2009     Model #: [id number]     Serial #: [id number]     Status: active Lead 2:    Location: RV     DOI: 03/29/2009     Model #: [id number]     Serial #: [id number]     Status: active  Indications::  CM   ICD Follow Up ICD Dependent:  No      Episodes Coumadin:  No  Brady Parameters Mode MVP     Lower Rate Limit:  60     Upper Rate Limit 130 PAV 180     Sensed AV Delay:  150  Tachy Zones VF:  200     Impression & Recommendations:  Problem # 1:  CORONARY ATHEROSCLEROSIS NATIVE CORONARY ARTERY  (ICD-414.01)  No progressive angina. Patient reports compliance with her medications. Not on aspirin in light of previous significant gastrointestinal bleeding. Continue symptom observation with followup visit in 6 months  Her updated medication list for this problem includes:    Amlodipine Besylate 5 Mg Tabs (Amlodipine besylate) .Marland Kitchen... Take one tablet by mouth daily    Carvedilol 12.5 Mg Tabs (Carvedilol) .Marland Kitchen... Take one tablet by mouth twice a day  Problem # 2:  ISCHEMIC CARDIOMYOPATHY (ICD-414.8)  Weight is down compared to last visit. Patient reports good control of edema with present diuretic regimen. Status post ICD interrogation with defibrillation threshold check by Dr. Johney Frame back in November, overall stable. No device shocks.  Her updated medication list for this problem includes:  Amlodipine Besylate 5 Mg Tabs (Amlodipine besylate) .Marland Kitchen... Take one tablet by mouth daily    Bumetanide 2 Mg Tabs (Bumetanide) ..... Once daily    Spironolactone 25 Mg Tabs (Spironolactone) .Marland Kitchen..Marland Kitchen Two times a day    Carvedilol 12.5 Mg Tabs (Carvedilol) .Marland Kitchen... Take one tablet by mouth twice a day  Problem # 3:  ESSENTIAL HYPERTENSION, BENIGN (ICD-401.1)  Blood pressure well controlled today.  Her updated medication list for this problem includes:    Amlodipine Besylate 5 Mg Tabs (Amlodipine besylate) .Marland Kitchen... Take one tablet by mouth daily    Bumetanide 2 Mg Tabs (Bumetanide) ..... Once daily    Spironolactone 25 Mg Tabs (Spironolactone) .Marland Kitchen..Marland Kitchen Two times a day    Carvedilol 12.5 Mg Tabs (Carvedilol) .Marland Kitchen... Take one tablet by mouth twice a day  Problem # 4:  BRADYCARDIA-TACHYCARDIA SYNDROME (ICD-427.81)  Atrial pacing.  Her updated medication list for this problem includes:    Amlodipine Besylate 5 Mg Tabs (Amlodipine besylate) .Marland Kitchen... Take one tablet by mouth daily    Carvedilol 12.5 Mg Tabs (Carvedilol) .Marland Kitchen... Take one tablet by mouth twice a day  Problem # 5:  ATRIAL FLUTTER (ICD-427.32)  History of  ablation 2010, doing well. Not on Coumadin related to prior history of significant gastrointestinal bleeding.  Her updated medication list for this problem includes:    Carvedilol 12.5 Mg Tabs (Carvedilol) .Marland Kitchen... Take one tablet by mouth twice a day  Orders: EKG w/ Interpretation (93000)  Patient Instructions: 1)  Your physician wants you to follow-up in: 6 months. You will receive a reminder letter in the mail one-two months in advance. If you don't receive a letter, please call our office to schedule the follow-up appointment. 2)  Your physician recommends that you continue on your current medications as directed. Please refer to the Current Medication list given to you today. 3)  Your physician discussed the hazards of tobacco use.  Tobacco use cessation is recommended and techniques and options to help you quit were discussed.

## 2010-07-16 ENCOUNTER — Telehealth: Payer: Self-pay | Admitting: Internal Medicine

## 2010-07-22 LAB — BASIC METABOLIC PANEL
BUN: 22 mg/dL (ref 6–23)
CO2: 28 mEq/L (ref 19–32)
Chloride: 98 mEq/L (ref 96–112)
Creatinine, Ser: 1.82 mg/dL — ABNORMAL HIGH (ref 0.4–1.2)
Glucose, Bld: 164 mg/dL — ABNORMAL HIGH (ref 70–99)

## 2010-07-22 LAB — CBC
Hemoglobin: 10.6 g/dL — ABNORMAL LOW (ref 12.0–15.0)
MCHC: 33.7 g/dL (ref 30.0–36.0)
RBC: 3.86 MIL/uL — ABNORMAL LOW (ref 3.87–5.11)
WBC: 6.6 10*3/uL (ref 4.0–10.5)

## 2010-07-22 LAB — PROTIME-INR
INR: 1.07 (ref 0.00–1.49)
Prothrombin Time: 14.1 seconds (ref 11.6–15.2)

## 2010-07-22 NOTE — Progress Notes (Signed)
Summary: pt pacer site broke out/swollen  Phone Note Call from Patient   Caller: Patient [phone] Reason for Call: Talk to Nurse Summary of Call: area around pacer,placed last year, is swollen and broke out since yesterday Initial call taken by: Glynda Jaeger,  July 16, 2010 9:10 AM  Follow-up for Phone Call        Patient states that her pacer site is red/itching and broken out. She is also broken out on bilateral arms and her abdomen. She took some pain medication and believes she had an allergic reaction. She is not c/o SOB. Advised pt. that it seems like she did have an allergic reaction to this pain medication and to stop taking the medicine & take Benadryl. She will call to f/u with her PCP. Whitney Maeola Sarah RN  July 16, 2010 9:25 AM  Follow-up by: Whitney Maeola Sarah RN,  July 16, 2010 9:25 AM

## 2010-08-13 LAB — PROTIME-INR
INR: 2.26 — ABNORMAL HIGH (ref 0.00–1.49)
INR: 2.42 — ABNORMAL HIGH (ref 0.00–1.49)
Prothrombin Time: 24.8 seconds — ABNORMAL HIGH (ref 11.6–15.2)

## 2010-08-13 LAB — GLUCOSE, CAPILLARY
Glucose-Capillary: 106 mg/dL — ABNORMAL HIGH (ref 70–99)
Glucose-Capillary: 277 mg/dL — ABNORMAL HIGH (ref 70–99)

## 2010-08-13 LAB — CBC
MCHC: 33.1 g/dL (ref 30.0–36.0)
RBC: 3.77 MIL/uL — ABNORMAL LOW (ref 3.87–5.11)
RBC: 4.18 MIL/uL (ref 3.87–5.11)
RDW: 19 % — ABNORMAL HIGH (ref 11.5–15.5)
WBC: 6.3 10*3/uL (ref 4.0–10.5)

## 2010-08-13 LAB — BASIC METABOLIC PANEL
CO2: 29 mEq/L (ref 19–32)
Calcium: 8.6 mg/dL (ref 8.4–10.5)
Calcium: 9.3 mg/dL (ref 8.4–10.5)
Creatinine, Ser: 1.26 mg/dL — ABNORMAL HIGH (ref 0.4–1.2)
Creatinine, Ser: 1.27 mg/dL — ABNORMAL HIGH (ref 0.4–1.2)
GFR calc Af Amer: 52 mL/min — ABNORMAL LOW (ref >60)
GFR calc Af Amer: 53 mL/min — ABNORMAL LOW (ref >60)

## 2010-08-26 LAB — GLUCOSE, CAPILLARY
Glucose-Capillary: 123 mg/dL — ABNORMAL HIGH (ref 70–99)
Glucose-Capillary: 135 mg/dL — ABNORMAL HIGH (ref 70–99)
Glucose-Capillary: 156 mg/dL — ABNORMAL HIGH (ref 70–99)
Glucose-Capillary: 156 mg/dL — ABNORMAL HIGH (ref 70–99)
Glucose-Capillary: 158 mg/dL — ABNORMAL HIGH (ref 70–99)
Glucose-Capillary: 162 mg/dL — ABNORMAL HIGH (ref 70–99)
Glucose-Capillary: 163 mg/dL — ABNORMAL HIGH (ref 70–99)
Glucose-Capillary: 200 mg/dL — ABNORMAL HIGH (ref 70–99)
Glucose-Capillary: 225 mg/dL — ABNORMAL HIGH (ref 70–99)
Glucose-Capillary: 67 mg/dL — ABNORMAL LOW (ref 70–99)
Glucose-Capillary: 85 mg/dL (ref 70–99)
Glucose-Capillary: 86 mg/dL (ref 70–99)
Glucose-Capillary: 95 mg/dL (ref 70–99)

## 2010-08-26 LAB — COMPREHENSIVE METABOLIC PANEL
ALT: 20 U/L (ref 0–35)
ALT: 16 U/L (ref 0–35)
AST: 23 U/L (ref 0–37)
AST: 20 U/L (ref 0–37)
Albumin: 3.5 g/dL (ref 3.5–5.2)
Albumin: 3.1 g/dL — ABNORMAL LOW (ref 3.5–5.2)
Alkaline Phosphatase: 89 U/L (ref 39–117)
Alkaline Phosphatase: 75 U/L (ref 39–117)
BUN: 25 mg/dL — ABNORMAL HIGH (ref 6–23)
BUN: 20 mg/dL (ref 6–23)
CO2: 27 mEq/L (ref 19–32)
CO2: 34 mEq/L — ABNORMAL HIGH (ref 19–32)
Calcium: 10.1 mg/dL (ref 8.4–10.5)
Calcium: 9.3 mg/dL (ref 8.4–10.5)
Chloride: 98 mEq/L (ref 96–112)
Chloride: 94 mEq/L — ABNORMAL LOW (ref 96–112)
Creatinine, Ser: 1.17 mg/dL (ref 0.4–1.2)
Creatinine, Ser: 1.34 mg/dL — ABNORMAL HIGH (ref 0.4–1.2)
GFR calc Af Amer: 57 mL/min — ABNORMAL LOW (ref >60)
GFR calc Af Amer: 49 mL/min — ABNORMAL LOW (ref >60)
GFR calc non Af Amer: 47 mL/min — ABNORMAL LOW (ref >60)
GFR calc non Af Amer: 40 mL/min — ABNORMAL LOW (ref >60)
Glucose, Bld: 158 mg/dL — ABNORMAL HIGH (ref 70–99)
Glucose, Bld: 88 mg/dL (ref 70–99)
Potassium: 4.2 mEq/L (ref 3.5–5.1)
Potassium: 4.4 mEq/L (ref 3.5–5.1)
Sodium: 136 mEq/L (ref 135–145)
Sodium: 135 mEq/L (ref 135–145)
Total Bilirubin: 1 mg/dL (ref 0.3–1.2)
Total Bilirubin: 0.7 mg/dL (ref 0.3–1.2)
Total Protein: 7.5 g/dL (ref 6.0–8.3)
Total Protein: 6.3 g/dL (ref 6.0–8.3)

## 2010-08-26 LAB — BASIC METABOLIC PANEL
BUN: 19 mg/dL (ref 6–23)
CO2: 31 mEq/L (ref 19–32)
Calcium: 8.8 mg/dL (ref 8.4–10.5)
Chloride: 98 mEq/L (ref 96–112)
Creatinine, Ser: 1 mg/dL (ref 0.4–1.2)
GFR calc Af Amer: >60 mL/min (ref >60)
GFR calc non Af Amer: 57 mL/min — ABNORMAL LOW (ref >60)
Glucose, Bld: 78 mg/dL (ref 70–99)
Potassium: 4 mEq/L (ref 3.5–5.1)
Sodium: 136 mEq/L (ref 135–145)

## 2010-08-26 LAB — TYPE AND SCREEN
ABO/RH(D): A NEG
Antibody Screen: NEGATIVE

## 2010-08-26 LAB — BLOOD GAS, ARTERIAL
Acid-Base Excess: 4 mmol/L — ABNORMAL HIGH (ref 0.0–2.0)
Acid-Base Excess: 1.5 mmol/L (ref 0.0–2.0)
Acid-Base Excess: 3.2 mmol/L — ABNORMAL HIGH (ref 0.0–2.0)
Bicarbonate: 27.7 mEq/L — ABNORMAL HIGH (ref 20.0–24.0)
Bicarbonate: 26.2 mEq/L — ABNORMAL HIGH (ref 20.0–24.0)
Bicarbonate: 26.4 mEq/L — ABNORMAL HIGH (ref 20.0–24.0)
Drawn by: 206361
Drawn by: 18179
FIO2: 0.21 %
FIO2: 0.5 %
MECHVT: 700 mL
O2 Content: 6 L/min
O2 Saturation: 98.3 %
O2 Saturation: 100 %
O2 Saturation: 99.7 %
PEEP: 5 cmH2O
Patient temperature: 98.6
Patient temperature: 98.6
Patient temperature: 98.6
RATE: 10 resp/min
TCO2: 28.9 mmol/L (ref 0–100)
TCO2: 27.2 mmol/L (ref 0–100)
TCO2: 27.9 mmol/L (ref 0–100)
pCO2 arterial: 39.2 mmHg (ref 35.0–45.0)
pCO2 arterial: 33 mmHg — ABNORMAL LOW (ref 35.0–45.0)
pCO2 arterial: 48.7 mmHg — ABNORMAL HIGH (ref 35.0–45.0)
pH, Arterial: 7.463 — ABNORMAL HIGH (ref 7.350–7.400)
pH, Arterial: 7.354 (ref 7.350–7.400)
pH, Arterial: 7.512 — ABNORMAL HIGH (ref 7.350–7.400)
pO2, Arterial: 95.9 mmHg (ref 80.0–100.0)
pO2, Arterial: 163 mmHg — ABNORMAL HIGH (ref 80.0–100.0)
pO2, Arterial: 214 mmHg — ABNORMAL HIGH (ref 80.0–100.0)

## 2010-08-26 LAB — CBC
HCT: 32.5 % — ABNORMAL LOW (ref 36.0–46.0)
HCT: 28.1 % — ABNORMAL LOW (ref 36.0–46.0)
HCT: 28.1 % — ABNORMAL LOW (ref 36.0–46.0)
Hemoglobin: 11.1 g/dL — ABNORMAL LOW (ref 12.0–15.0)
Hemoglobin: 9.5 g/dL — ABNORMAL LOW (ref 12.0–15.0)
Hemoglobin: 9.5 g/dL — ABNORMAL LOW (ref 12.0–15.0)
MCHC: 34.1 g/dL (ref 30.0–36.0)
MCHC: 33.7 g/dL (ref 30.0–36.0)
MCHC: 33.9 g/dL (ref 30.0–36.0)
MCV: 81.3 fL (ref 78.0–100.0)
MCV: 80.9 fL (ref 78.0–100.0)
MCV: 81.5 fL (ref 78.0–100.0)
Platelets: 285 10*3/uL (ref 150–400)
Platelets: 206 10*3/uL (ref 150–400)
Platelets: 237 10*3/uL (ref 150–400)
RBC: 4.01 MIL/uL (ref 3.87–5.11)
RBC: 3.45 MIL/uL — ABNORMAL LOW (ref 3.87–5.11)
RBC: 3.47 MIL/uL — ABNORMAL LOW (ref 3.87–5.11)
RDW: 18.1 % — ABNORMAL HIGH (ref 11.5–15.5)
RDW: 16.5 % — ABNORMAL HIGH (ref 11.5–15.5)
RDW: 16.8 % — ABNORMAL HIGH (ref 11.5–15.5)
WBC: 6.7 10*3/uL (ref 4.0–10.5)
WBC: 4.8 10*3/uL (ref 4.0–10.5)
WBC: 5.3 10*3/uL (ref 4.0–10.5)

## 2010-08-26 LAB — PROTIME-INR
INR: 1 (ref 0.00–1.49)
INR: 1 (ref 0.00–1.49)
INR: 1.1 (ref 0.00–1.49)
INR: 1.1 (ref 0.00–1.49)
Prothrombin Time: 13.3 seconds (ref 11.6–15.2)
Prothrombin Time: 13.5 seconds (ref 11.6–15.2)
Prothrombin Time: 14 seconds (ref 11.6–15.2)
Prothrombin Time: 14.5 seconds (ref 11.6–15.2)

## 2010-08-26 LAB — URINALYSIS, ROUTINE W REFLEX MICROSCOPIC
Bilirubin Urine: NEGATIVE
Glucose, UA: NEGATIVE mg/dL
Hgb urine dipstick: NEGATIVE
Ketones, ur: NEGATIVE mg/dL
Nitrite: NEGATIVE
Protein, ur: NEGATIVE mg/dL
Specific Gravity, Urine: 1.014 (ref 1.005–1.030)
Urobilinogen, UA: 0.2 mg/dL (ref 0.0–1.0)
pH: 7 (ref 5.0–8.0)

## 2010-08-26 LAB — APTT: aPTT: 29 seconds (ref 24–37)

## 2010-09-04 ENCOUNTER — Ambulatory Visit (INDEPENDENT_AMBULATORY_CARE_PROVIDER_SITE_OTHER): Payer: Medicare Other | Admitting: *Deleted

## 2010-09-04 DIAGNOSIS — I428 Other cardiomyopathies: Secondary | ICD-10-CM

## 2010-09-04 DIAGNOSIS — I5022 Chronic systolic (congestive) heart failure: Secondary | ICD-10-CM

## 2010-09-05 ENCOUNTER — Other Ambulatory Visit: Payer: Self-pay | Admitting: Internal Medicine

## 2010-09-14 NOTE — Progress Notes (Signed)
icd remote w/icm  

## 2010-09-17 ENCOUNTER — Encounter: Payer: Self-pay | Admitting: *Deleted

## 2010-09-23 NOTE — Discharge Summary (Signed)
[name], MULLANE NO.:  [id number]   MEDICAL RECORD NO.:  [id number]          PATIENT TYPE:  INP   LOCATION:  2019                         FACILITY:  [facility]   PHYSICIAN:  Diana Fudge, PA DATE OF BIRTH:  1950/05/26   DATE OF ADMISSION:  08/12/2007  DATE OF DISCHARGE:  08/24/2007                               DISCHARGE SUMMARY   ADMITTING DIAGNOSES:  1. Non-ST-segment elevation myocardial infarction.  2. Multivessel coronary artery disease (100% left main), decreased      ejection fraction of 40%.  3. Congestive heart failure.  4. History of diabetes mellitus.  5. History of hyperlipidemia.  6. History of chronic obstructive pulmonary disease.  7. History of hypertension.  8. History of obesity.   DISCHARGE DIAGNOSES:  1. Non-ST-segment elevation myocardial infarction.  2. Multi vessel coronary artery disease (100% left main), decreased      ejection fraction of 40%.  3. Congestive heart failure.  4. History of diabetes mellitus.  5. History of hyperlipidemia.  6. History of chronic obstructive pulmonary disease.  7. History of hypertension.  8. History of obesity.  9. Atrial fibrillation.   OPERATIVE PROCEDURES:  1. Cardiac catheterization done by Dr. Juanda Howard on August 12, 2007.  2. Coronary artery bypass grafting x4 (LIMA to LAD, SVG to ramus      intermedius, and SVG to circumflex), marginal SVG to PDA with      endoscopic vein harvest of the right lower extremity.  3. Coronary endarterectomy of the LAD by Dr. Donata Howard on August 16, 2007.   BRIEF HOSPITAL COURSE STAY:  This is a 60 year old Hispanic female, who  originally presented to [facility] with complaints of acute chest  pain and shortness of breath.  The patient was found to have EKG changes  and positive cardiac enzymes.  In addition, chest x-ray showed CHF and  EKG did show evidence of DMI.  She was then transferred to [facility] for urgent cardiac  catheterization, which she underwent on  August 12, 2007.  She was found to have 100% stenosis of the left main and  total chronic occlusion of the right coronary artery, 90% stenosis of  the LAD, and multiple areas with reduced flow, and 88% stenosis of the  ramus intermedius and circumflex marginal.  Her EF was 40%.  She was  then transferred to CCU and placed on low-dose dobutamine and Lasix.  Echocardiogram was done that showed inferior hypokinesia, but no  significant MR or TR.  Carotid duplex showed no internal carotid artery  stenosis on the right, a 40-60% left internal carotid artery stenosis.  ABIs and Doppler waveforms were essentially normal bilaterally at rest.  For specifics of the postoperative course stay, please see the medical  record, but in brief, the patient developed postoperative atrial  fibrillation.  She was placed on an amiodarone drip and was then given  amiodarone by mouth.  She converted to normal sinus rhythm; however,  then went back into atrial fibrillation.  She was placed on Plavix.  She  was diuresed daily because of volume  overload.  She did exhibit some  junctional rhythm and then sinus bradycardia.  She was paced.  Lopressor  was held accordingly and amiodarone dosage was decreased.  The patient's  heart rate later remained in the 50s to 70s.  Pacer was discontinued.  Her rate and rhythm were monitored closely.  She continued to improve  such that by postoperative day # 7, she had a T-max of 99.3, heart rate  were in the 60s, blood pressure was 108/71, and O2 sat was 91-92% on  room air.  Her preoperative weight was 82 kilograms, later was 85.3  kilograms, (which was down from 86 kg).  CBG is 92 and 80 respectively.   PHYSICAL EXAMINATION:  CARDIOVASCULAR:  Regular rate and rhythm.  PULMONARY:  Mostly clear.  EXTREMITIES:  +1 edema more so on the right lower extremities.  Her  right lower extremity wounds and sternal wounds are clean and dry.  No   signs of infection.   Her rate and rhythm will be monitored over the next 24 hours and she  mostly likely will be discharged on August 24, 2007.   LABORATORY DATA:  Laboratory studies showed the CBC with H&H of 9.5 and  27.6 respectively, white count of 20 and platelet count of 20,000.  SMA-  7, potassium was 3.8.  BUN and creatinine were 11 and 0.95 respectively.  Last chest x-ray done on August 20, 2007 showed improved aeration in the  left lower lobe, residual bilateral atelectasis, and pleural effusions,  left greater than right.   DISCHARGE INSTRUCTIONS:  Include the following.  The patient is to  continue using spirometer daily (breathing exercises).  She is to walk  daily.  No driving.  No lifting more than 10 pounds until instructed  otherwise.  Chest tube sutures and pacing wires will be removed prior to  her discharge.  The patient's followup appointments include Dr. Juanda Howard  in 2 weeks.  She must call his office for an appointment with Dr. Morton Howard on Sep 09, 2007 at 12:15.  Prior to the appointment with Dr. Donata Howard, chest x-ray will be obtained.   DISCHARGE MEDICATIONS:  Include the following:  1. Ambien 10 mg by mouth at bedtime as needed for sleep.  2. Fluoxetine 20 mg by mouth daily.  3. Mirapex 0.25 mg by mouth twice daily.  4. Singulair 10 mg by mouth daily.  5. Omeprazole 20 mg by mouth daily.  6. Plavix 75 mg by mouth daily.  7. Glipizide 5 mg by mouth twice daily.  8. Metformin 1000 mg by mouth twice daily.  9. ECASA 1 mg by mouth daily.  10.Amiodarone 200 mg by mouth twice daily.  11.Lipitor 20 mg as needed at bedtime.  12.Advair Diskus 250/50 1 puff 2 times daily.  13.Oxycodone 5 mg 1-2 tablets by mouth every 4-6 hours as needed for      pain.   In addition, it will be determined on the day of discharge whether or  not metoprolol 12.5 mg by mouth twice daily will be continued as well as  whether or not the patient will require Lasix with the potassium   supplement.     Diana Fudge, PA    DZ/MEDQ  D:  08/23/2007  T:  08/24/2007  Job:  [id number]

## 2010-09-23 NOTE — Discharge Summary (Signed)
[name], [name] NO.:  [id number]   MEDICAL RECORD NO.:  [id number]          PATIENT TYPE:  INP   LOCATION:  2031                         FACILITY:  [facility]   PHYSICIAN:  Kerin Perna, M.D.  DATE OF BIRTH:  January 02, 1951   DATE OF ADMISSION:  06/27/2008  DATE OF DISCHARGE:  06/20/2008                               DISCHARGE SUMMARY   PRIMARY ADMITTING DIAGNOSIS:  Sternal malunion.   ADDITIONAL/DISCHARGE DIAGNOSES:  1. Sternal malunion.  2. Coronary artery disease status post coronary artery bypass grafting      x4 in April 2009.  3. Chronic obstructive pulmonary disease.  4. Type 2 diabetes mellitus.  5. Sleep apnea.  6. Obesity.  7. Prior history of tobacco abuse.   PROCEDURES PERFORMED:  Sternal re-closure.   HISTORY:  The patient is a 60 year old Hispanic female who is status  post urgent coronary artery bypass grafting in April 2009.  She has done  well since her surgery.  However, when she was recently seen by her  cardiologist, she was noted to have a clicking of the sternum with  evidence of a fibrous malunion.  This was initially noted on physical  exam and a chest x-ray was performed which confirmed this issue.  She  has had worsening chest discomfort and breast pain which was felt to be  secondary to the sternal malunion.  She was subsequently referred back  to Dr. Donata Clay for evaluation and treatment.  Dr. Donata Clay reviewed  her x-ray and noted a significant sternal click with separation of the  sternal edges approximately 2-3 mm with scar tissue on physical exam.  He felt that she should undergo a redo sternotomy and possible sternal  rewiring.  He explained all the risks, benefits and alternatives of the  procedure to the patient and she agreed to proceed.   HOSPITAL COURSE:  [name] was admitted to [facility] on  June 27, 2008 and was taken to the operating room where she  underwent sternal re-closure.   Intraoperatively, it was noted that her  sternum was healed but the fascia was torn part with soft tissue  defects.  She tolerated the procedure well and was transferred to the  SICU for overnight observation.  She was doing well on postop day #1 and  was able to be transferred to the floor.  Overall, her postoperative  course has been uneventful.  Her sternal incision is healing well and  her sternum has remained stable.  Her chest x-ray has remained stable  with good healing and some lower lobe atelectasis.  She has been  afebrile and her vital signs have been stable.  She did have one brief  run of SVT with heart rates in the 140s to 150s but this resolved and  she has been continued on Coreg which she had taken preoperatively.  Her  labs on postop day #2 show hemoglobin of 9.5, hematocrit 28.1, and  platelets 251.  White count 5.3.  Sodium 135, potassium 4.4, BUN 20,  creatinine 1.34.  It was felt that if she continues to remain stable  over the next 24 hours, she will hopefully be ready for discharge home  on June 30, 2008.   DISCHARGE MEDICATIONS:  1. Coreg 3.125 mg b.i.d.  2. Ultram 50 mg q.4 h. p.r.n. for pain.  3. Lantus 25 units b.i.d.  4. Prozac 20 mg daily.  5. Prevacid 30 mg daily.  6. Singulair 10 mg daily.  7. Lipitor 20 mg at bedtime.  8. Synthroid 50 mcg daily.  9. Iron 325 mg daily.  10.Advair 250/0 one puff b.i.d.  11.Benadryl 25 mg b.i.d.  12.Ambien 10 mg at bedtime.  13.Mirapex  0.25 mg b.i.d.  14.Centrum multivitamin daily.  15.We will reevaluate her blood sugars at the time of discharge and if      they are stable, we will plan to restart her metformin 500 mg      b.i.d.   DISCHARGE INSTRUCTIONS:  She was asked to refrain from driving, heavy  lifting or strenuous activity.  She may ambulate daily and continue  breathing exercises.  She will continue her same preoperative diet.   DISCHARGE FOLLOWUP:  She will see Dr. Donata Clay back in the office in  2  weeks with a chest x-ray.  She may call in the interim if she  experiences any problems or has questions.      Coral Ceo, P.A.      Kerin Perna, M.D.  Electronically Signed    GC/MEDQ  D:  06/29/2008  T:  06/29/2008  Job:  [id number]   cc:   Jonelle Sidle, MD

## 2010-09-23 NOTE — Op Note (Signed)
[name], Diana NO.:  [id number]   MEDICAL RECORD NO.:  [id number]          PATIENT TYPE:  INP   LOCATION:  2301                         FACILITY:  [facility]   PHYSICIAN:  Kerin Perna, M.D.  DATE OF BIRTH:  1951/01/06   DATE OF PROCEDURE:  08/16/2007  DATE OF DISCHARGE:                               OPERATIVE REPORT   OPERATION:  1. Coronary artery bypass grafting x4 (left internal mammary artery to      the LAD, saphenous vein graft to the ramus intermediate, saphenous      vein graft to the circumflex marginal, saphenous vein graft to the      posterior descending).  2. Endoscopic vein harvest of the right leg greater saphenous vein.  3. Coronary endarterectomy of the LAD.   PREOPERATIVE DIAGNOSES:  Class IV unstable angina, status post acute  inferior wall myocardial infarction.   POSTOPERATIVE DIAGNOSES:  Class IV unstable angina, status post acute  inferior wall myocardial infarction.   ANESTHESIA:  General.   SURGEON:  Kerin Perna, M.D.   ASSISTANTS:  Sheliah Plane, MD  Gershon Crane PA-C.   INDICATIONS:  The patient is a 60 year old female smoker who presented  with acute chest pain, positive cardiac enzymes, and low blood pressure.  Cardiac catheterization demonstrated occlusion of the right coronary,  with a 70% stenosis of the left main and a high-grade 95% stenosis of  the LAD with multiple lesions; as well as occlusion of the circumflex  marginal.  Her EF was 40% and she required inotropic support.  She was  felt to be a candidate for surgical revascularization after recovery  from the MI.  I examined the patient in the CCU and reviewed results of  the cardiac cath with the patient; and discussed the indications and  expected benefits of coronary bypass surgery for treatment of her severe  coronary disease.  I also reviewed the alternatives to surgical therapy  and the risks of surgery, including risks of MI, CVA, bleeding, blood  transfusion requirement, infection, and death.  She understood that her  risk for surgery would be increased due to her severe smoking history  and COPD.  After reviewing these issues, she demonstrated her  understanding and agreed to proceed with the operation as planned, under  what I felt was an informed consent.   OPERATIVE FINDINGS:  The LAD was a diffuse, calcified vessel; and  required an endarterectomy in order to establish a distal anastomosis.  The ramus and circumflex targets were adequate, but the right coronary  and posterior descending were poor targets and very small.  The patient  received a unit of packed cells, due to a starting hematocrit of 29.  She received a unit of platelets and FFP, after termination of the  bypass and reversal of heparin -- due to persistent coagulopathy.   PROCEDURE:  The patient was brought to the operating room and placed  supine on the operating room table, where general anesthesia was induced  under invasive hemodynamic monitoring.  The chest, abdomen and legs were  prepped with Betadine and draped as a sterile  field.  A sternal incision  was made as the saphenous vein was harvested endoscopically from the  right leg.  The left femoral artery catheter was placed for arterial  blood pressure monitoring.  The left internal mammary artery was  harvested as a pedicle graft from its origin at the subclavian vessels;  it was a good vessel with excellent flow; it was small, however,  measuring 1.5 mm.  The sternal retractor was placed and the pericardium  was opened and suspended.  Pursestrings were placed in the ascending  aorta and right atrium.  After the vein had been harvested and found to  be adequate, the patient was given a full dose of heparin, cannulated  and placed on bypass.  The coronaries were identified for grafting, and  the mammary artery and vein grafts were prepared for the distal  anastomoses.  Cardioplegia catheters were placed  to both antegrade and  retrograde cold blood cardioplegia; and the patient was cooled to 32  degrees.  The aortic crossclamp was then applied, after the mammary  artery and vein grafts had been had been prepared for the distal  anastomoses.  Then 800 mL of cold blood cardioplegia was delivered in  split doses, between the antegrade aortic and retrograde coronary sinus  catheters.  There was good cardioplegic arrest, and septal temperature  dropped less than 14 degrees.  Cardioplegia was delivered every 20  minutes or less while the crossclamp was in place.   The distal coronary anastomoses were then performed.  The first distal  anastomosis was to the proximal posterior descending.  It was a 1-mm  vessel, with proximal total occlusion and calcification of the distal  right coronary.  A reverse saphenous vein was sewn end-to-side with  running 7-0 Prolene, and there was adequate flow through the graft.   The second distal anastomosis was of the ramus intermediate.  This was a  larger 1.5-mm vessel, with proximal 80% stenosis.  A reverse saphenous  vein was sewn end-to-side with running 7-0 Prolene, with good flow  through the graft.   The third distal anastomosis was the circumflex marginal.  This was a  smaller 1.4-mm vessel with proximal total occlusion.  A reverse  saphenous vein was sewn end-to-side with running 7-0 Prolene, with good  flow through the graft.  Cardioplegia was redosed.   The fourth distal anastomosis was to the distal third of the LAD.  It  was extremely calcified with a poor lumen, and an endarterectomy  extending towards the apex was successfully achieved.  The mammary  artery was then brought through an opening in the left lateral  pericardium, and a generous incision was made to lengthen the mammary  artery distal aspect to cover the endarterectomy space.  It was then  sewn end-to-side with running 8-0 Prolene to the LAD.  There was good  flow through the  graft.  The bulldog was then reapplied to the mammary  pedicle and the pedicle was secured to the epicardium.   Cardioplegia was redosed.  While the crossclamp was still in place, 3  proximal vein anastomoses were performed on the ascending aorta -- using  a 4.0-mm punch with running 7-0 Prolene.  Prior to turning down the  final proximal anastomosis, air was vented from the coronaries with a  dose of retrograde warm blood cardioplegia, and the usual de-airing  maneuvers on bypass.  The final proximal anastomosis was tied and the  crossclamp was removed.   The heart resumed  a spontaneous rhythm.  Air was aspirated from the vein  grafts.  The vein grafts were checked and found to have good flow.  Hemostasis was documented at the proximal distal anastomoses.  The  cardioplegia catheters were removed.  The patient was rewarmed to 37  degrees.  Temporary pacing wires were applied, including a left  ventricular lead in case the patient needed ventricular pacing.  The  lungs were expanded and the ventilator was resumed after the patient was  rewarmed to 37 degrees.  The patient was then weaned from the bypass  with low-dose dopamine.  Blood pressure and cardiac output were stable.  Protamine was administered without adverse reaction.  The cannula was  removed.  The mediastinum was irrigated with warm antibiotic irrigation.  The leg incision was irrigated and closed in a standard fashion.  The  superior pericardial fat was closed over the aorta.  Two mediastinal and  a left pleural chest tube were placed and brought out through separate  incisions.  The sternum was closed with interrupted steel wire.  The  pectoralis fascia was closed with a running #1 Vicryl.  Subcutaneous and  then the skin layers were closed with running Vicryl, and sterile  dressings were applied.   TOTAL BYPASS TIME:  170 minutes.   CROSSCLAMP TIME:  120 minutes.      Kerin Perna, M.D.  Electronically  Signed     PV/MEDQ  D:  08/16/2007  T:  08/16/2007  Job:  [id number]

## 2010-09-23 NOTE — H&P (Signed)
HISTORY AND PHYSICAL EXAMINATION   June 22, 2008   Re:  [name], Howard        DOB:  1950-09-20   ADMISSION DIAGNOSES:  1. Sternal malunion, status post coronary artery bypass graft x4 April      2009.  2. Chronic obstructive pulmonary disease, reformed smoker.  3. Diabetes mellitus with obesity.   PRESENT ILLNESS:  The patient is a 60 year old Hispanic female, who  underwent urgent coronary artery bypass grafting x4 in April 2009.  Preoperative EF was 35-40.  She had left IMA grafting to LAD and vein  grafts to the ramus intermediate, circumflex marginal, and posterior  descending.  She had a prolonged postoperative course with arrhythmia,  pulmonary insufficiency, oxygen requirement, fluid retention, and  deconditioning.  She was subsequently discharged home and was seen once  in the office for followup in May.  At that time, her incisions were  healed and the sternum was stable, although she did have a persistent  nagging cough.  She was released to be followed up by the cardiologist,  who noted that on a subsequent exam later in 2009, she had a clicking of  the sternum with evidence of a fibrous malunion.  This was confirmed  with physical exam and a chest x-ray.  The patient has also had other  significant problems since her bypass surgery including GI bleeding from  Coumadin and Arthrotec for arthritis and sleep apnea, restless leg  syndrome, anxiety/depression, and paroxysmal atrial fibrillation.  She  was reexamined in January at which time, she was still having  significant breast and chest pain from the fibrous malunion of the  sternal incision.  Her GI bleeding had resolved and we had stopped the  Coumadin.  A 2-D echo was performed which showed her EF not to be 50%  without significant valvular disease.  She now presents to schedule the  surgery as she has now been off cigarettes for several months.  Her  cough is significantly better.  I  suspect that the fibrous malunion came  from a sternal separation from coughing and her obesity.   PAST MEDICAL HISTORY:  1. Status post CABG in April 2009 with preoperative DMI, current EF      50%.  2. COPD, reformed smoker.  3. Sleep apnea.  4. Obese.  5. Diabetes.  6. Allergy to penicillin.  7. History of upper GI bleeding secondary to NSAIDS.   SOCIAL HISTORY:  The patient lives with her daughter and grandchildren.  She does not smoke or use alcohol.   REVIEW OF SYSTEMS:  No recent fever, productive cough, or upper  respiratory illness.  She has been off of her aspirin, Plavix, and  Coumadin due to the GI bleeding and requirement for blood transfusion  earlier in the winter.  She denies difficulty swallowing or significant  ankle edema.   HOME MEDICATIONS:  1. Prozac 20 mg daily.  2. Metformin 1 g b.i.d.  3. Advair inhaler 250/50 b.i.d.  4. Benadryl 25 mg b.i.d.  5. Singulair 10 mg daily.  6. Lipitor 20 mg daily.  7. Lasix 40 mg b.i.d.  8. Potassium 20 mEq daily.  9. Lisinopril 20 mg daily.  10.Lantus 20 units b.i.d.  11.Prevacid 30 mg daily.  12.Coreg 12.5 b.i.d.  13.Vitamins.  14.Ambien 10 mg nightly.  15.Ultram 50 mg b.i.d. p.r.n. pain.   ALLERGIES:  Penicillin causes rash and swelling.   PHYSICAL EXAMINATION:  VITAL SIGNS:  Blood pressure 134/78, pulse 60,  respirations  18, and saturation 97%.  She is afebrile.  The patient is 5  feet 2 and weighs approximately 170 pounds.  GENERAL APPEARANCE:  A middle-aged Hispanic female in no acute distress.  HEENT:  Normocephalic.  NECK:  Without JVD or mass.  CHEST:  Popping of the sternum and a separation of sternal edges of  approximately 2-3 mm with scar tissue.  The sternum itself is healed.  There is no evidence of infection.  CARDIAC:  Rhythm is regular without S3 gallop, murmur, or rub.  ABDOMEN:  Obese, soft, and nontender without pulsatile mass.  EXTREMITIES:  No clubbing, cyanosis, or edema.  Peripheral  pulses are 1+  in the lower extremities.  NEUROLOGIC:  Nonfocal.   LABORATORY DATA:  Most recent x-ray shows no acute lung disease.  Her  last echo shows EF of 50%.  Her ABIs are 1.0 bilaterally and her carotid  duplex exam demonstrates 40-60% left ICA stenosis.  No significant right  ICA stenosis.   PLAN:  The patient will be cleared for redo sternotomy and rewiring on  June 27, 2008.  I discussed the indications, benefits, and risk of  the surgery with the patient.  She understands and agrees to proceed.   Kerin Perna, M.D.  Electronically Signed   PV/MEDQ  D:  06/22/2008  T:  06/23/2008  Job:  [id number]   cc:   [facility] Office  Ernestine Conrad, MD

## 2010-09-23 NOTE — Assessment & Plan Note (Signed)
OFFICE VISIT   [name], [name]  DOB:  [birth year]                                        July 13, 2008  CHART #:  [id number]   CURRENT PROBLEMS:  1. Status post sternal incision reconstruction for chest wall defect      after coronary artery bypass graft x4 April 2009.  2. Diabetes.  3. Chronic obstructive pulmonary disease, reformed smoker.   The patient is a 60 year old female, who underwent multivessel CABG  approximately 1 year ago.  She returned with a painful sternal incision  this past winter with a midline defect in the chest wall.  It was felt  that she had sternal wire migration or sternal malunion.  She underwent  sternal re-exploration under anesthesia on June 27, 2008.  The  sternal wires were intact and the bone itself was healed, but the  pectoralis fascia had separated and was responsible for the defect and  the discomfort.  This was reconstructed without prosthetic material and  she now returns for followup.  She states she is feeling much better  with less incisional pain.  She continues to be smoke-free.  She has had  no fever or trouble with the incision.   PHYSICAL EXAMINATION:  VITAL SIGNS:  Blood pressure 130/70, pulse 60,  respirations 18, and saturation 98%.  GENERAL:  She is alert and pleasant.  LUNGS:  Breath sounds are clear and equal.  CHEST:  The sternum is stable and incision is healing well.  There is no  defect in the incision at this time.   PA and lateral chest x-ray shows sternal wires to be intact and well  aligned and there is no active pulmonary disease.   PLAN:  The patient will return to her cardiac rehab.  She will avoid  lifting more than 20 pounds until Sep 08, 2008.  Routine skin care for  the incision is all that is needed at this time.  She will return as  needed.   Kerin Perna, M.D.  Electronically Signed   PV/MEDQ  D:  07/13/2008  T:  07/13/2008  Job:  [id number]   cc:   Jonelle Sidle,  MD  Dr. Loney Hering

## 2010-09-23 NOTE — Op Note (Signed)
NAMENYSIA, DELL NO.:  [id number]   MEDICAL RECORD NO.:  [id number]          PATIENT TYPE:  INP   LOCATION:  2304                         FACILITY:  [facility]   PHYSICIAN:  Kerin Perna, M.D.  DATE OF BIRTH:  1950-09-11   DATE OF PROCEDURE:  06/27/2008  DATE OF DISCHARGE:                               OPERATIVE REPORT   OPERATION:  Sternal incision revision.   SURGEON:  Kerin Perna, MD   ANESTHESIA:  General.   PREOPERATIVE DIAGNOSIS:  Malunion of the sternal incision, status post  coronary artery bypass graft, April 2009.   POSTOPERATIVE DIAGNOSIS:  Malunion of the sternal incision, status post  coronary artery bypass graft, April 2009.   PROCEDURE:  The patient was brought to the operative room after a  preoperative office consultation and exam of her sternal wound, which  had a defect and chronic pain.  It is felt that she would probably need  a sternal incision reconstruction and possibly rewiring.   The patient was induced for general anesthesia, and the chest and  abdomen were prepped and draped as a sterile field.  The old sternal  incision was made.  The incision was taken down through the fat and  pectoralis fascia to the sternum.  The sternal bone was healed and in  good apposition with all the sternal wires intact.  The defect was in  the soft tissue pectoralis muscle.  I reconstructed the incision with  several interrupted #1 Vicryl sutures to reappose the pectoralis fascia.  I closed the subcutaneous with a running 2-0 Vicryl, and the skin with a  subcuticular Vicryl.  Sterile dressings were applied.  The patient  returned to the recovery room in stable condition.      Kerin Perna, M.D.  Electronically Signed     PV/MEDQ  D:  06/27/2008  T:  06/27/2008  Job:  [id number]

## 2010-09-23 NOTE — Assessment & Plan Note (Signed)
[facility]                          [facility] OFFICE NOTE   Diana, Howard                     MRN:          [id number]  DATE:05/21/2008                            DOB:          12-18-1950    PRIMARY CARDIOLOGIST:  Diana Sidle, MD   REASON FOR VISIT:  Post hospital followup.   [name] was recently hospitalized here at [facility] (12/18 - 23)  with GI bleed and marked electrolyte abnormality, with a sodium level of  120.  She presented with a hemoglobin level of 7.9 and required  transfusion.  She was felt to have an occult GI bleed, felt to be  secondary to nonsteroidals in conjunction with aspirin, and on chronic  Coumadin.  She also presented with moderate volume overload and was  discharged on an increased dose of Lasix at 60 mg daily.  She was not  seen in consultation by our team.   Since her release, she has remained clinically stable.  She has had  outpatient followup with Dr. Erskine Howard, and has undergone an upper  endoscopy which was normal.  She was noted to have had a previous  colonoscopy in March 2009, also negative.  She informs me today that she  is scheduled to follow up with Dr. Gabriel Howard, with plans for possible  small bowel capsule follow through.   Clinically, she denies any orthopnea, PND, or lower extremity edema.  She denies any tachy palpitations or chest pain.  She is tolerating her  medications and denies any evidence of melena or BRBPR.   The patient has not had any post hospital blood work.  A followup  protime on January 8 was 0.9.  She had held her Coumadin for 3 days  prior to her recent upper endoscopy, with instructions to resume it  later that same day.   CURRENT MEDICATIONS:  1. Iron.  2. Synthroid 0.025 daily.  3. Lipitor 20 daily.  4. Lasix 40 daily.  5. Lisinopril 20 daily.  6. Coumadin 5 mg as directed.  7. Carvedilol 3.125 b.i.d.  8. Singular 10 daily.  9. Prevacid.  10.Fluoxetine.  11.Lantus 20 units daily.  12.Advair.   PHYSICAL EXAMINATION:  VITAL SIGNS:  Blood pressure 152/78, pulse 57,  regular, weight 197 (up 12).  GENERAL:  [name], moderately obese, sitting upright, no  distress.  HEENT:  Normocephalic, atraumatic.  NECK:  Palpable, bilateral carotid pulses without bruits; no JVD at 90  degrees.  LUNGS:  Clear to auscultation in all fields.  HEART:  Regular rate and rhythm.  No significant murmurs.  ABDOMEN:  Protuberant, nontender.  EXTREMITIES:  Intact pulses without edema.   IMPRESSION:  1. Paroxysmal atrial fibrillation.      a.     CHAD2 score:  3, on chronic Coumadin.  2. Ischemic cardiomyopathy.      a.     EF 40%, April 2009.      b.     Status post NSTEMI/4v CABG, April 2009.  3. Chronic systolic heart failure, currently euvolemic.  4. Recurrent hyponatremia.  5. Status post occult gastrointestinal bleed.  a.     Felt to be secondary to combination of Arthrotec,       nonsteroidals, aspirin, and Coumadin.  6. Type 2 diabetes mellitus.  7. Dyslipidemia.  8. Hypertension.   PLAN:  1. Complete followup blood work with BMET, CBC, BNP, and protime.  2. Continue Coumadin anticoagulation, given elevated risk of stroke,      with recommendation to hold, if needed, prior to undergoing any      further evaluation for anemia.  3. Consider adjustment of her antihypertensive regimen.  However, I      will await review of her blood work, before considering up      titration of her lisinopril, for example.  4. The patient is advised to weigh herself daily and to refrain from      any added salt.  5. Schedule return clinic followup with myself and Dr. Diona Howard in 2      months.      Diana Searing, PA-C  Electronically Signed      Diana Sidle, MD  Electronically Signed   GS/MedQ  DD: 05/21/2008  DT: 05/22/2008  Job #: [id number]   cc:   Diana Downer, MD

## 2010-09-23 NOTE — Assessment & Plan Note (Signed)
[facility]                          [facility] OFFICE NOTE   [name], [name]                     MRN:          [id number]  DATE:08/30/2007                            DOB:          [birth year]    PRIMARY CARE PHYSICIAN:  Dr. Ernestine Conrad.   CARDIOTHORACIC SURGEON:  Kerin Perna, M.D.   REASON FOR VISIT:  Follow-up coronary bypass grafting.   HISTORY OF PRESENT ILLNESS:  [name] is a 60 year old woman with a  history of multivessel/left main coronary artery disease that was  diagnosed at cardiac catheterization by Dr. Juanda Chance on August 12, 2007.  The patient had presented initially to [facility] at  that time with chest discomfort and shortness of breath, subsequently  ruling in for a non-ST elevation myocardial infarction.  She underwent  coronary artery bypass grafting by Dr. Donata Clay on April 7,2009 with a  LIMA to the left anterior descending, saphenous vein graft to the ramus  intermedius, saphenous vein graft to the circumflex marginal and  saphenous vein graft to the posterior descending.  She also had a  coronary endarterectomy of the left anterior descending at that time.  Postoperative course was complicated by paroxysmal atrial fibrillation,  and the patient was ultimately placed on amiodarone.  She was apparently  also seen in consultation by electrophysiology, given relative  bradycardia on beta blocker therapy.  Beta blocker was discontinued.  She was noted to have a CHADS2 score of 3, and Coumadin was discussed,  although I do not see that it was ever initiated.  It was suggested that  she have a cardiac monitor to see if, in fact, she continued to manifest  arrhythmias on amiodarone.  She comes into the office today actually  feeling fairly well.  She has been gradually increasing her walking.  She is due to see Dr. Donata Clay back in early May.  Her  electrocardiogram today shows sinus rhythm at 70  beats per minute with  anterolateral T-wave inversions, most noted in the anteroseptal leads.  Also evidence of previous inferior wall infarction.  She is not  reporting any drainage from her sternal incision which has been healing  well by her account.  She has had no major problems with lower extremity  edema, orthopnea or PND.  She states only of an occasional heart skip  but no prolonged palpitations.   ALLERGIES:  PENICILLIN.   PRESENT MEDICATIONS:  1. Ambien 10 mg p.o. q.h.s.  2. Fluoxetine 20 mg p.o. daily.  3. Mirapex 0.25 mg p.o. b.i.d.  4. Advair as directed.  5. Metformin 1000 mg p.o. b.i.d.  6. Amiodarone 200 mg p.o. daily.  7. Lipitor 20 mg p.o. daily.  8. Omeprazole 20 mg p.o. daily.  9. Plavix 75 mg p.o. daily.  10.Glipizide 5 mg p.o. b.i.d.  11.Singulair 10 mg p.o. daily.  12.Enteric-coated aspirin 81 mg p.o. daily.   REVIEW OF SYSTEMS:  As described in the present illness.  Otherwise  negative.   PHYSICAL EXAMINATION:  VITAL SIGNS:  Blood pressure is 125/82, heart  rate is 70, weight is 176  pounds.  GENERAL:  The patient is comfortable, in no acute distress.  HEENT:  Conjunctivae normal.  Oropharynx clear.  NECK:  Supple.  No elevated jugular venous pressure.  No loud bruits.  LUNGS:  Generally clear without labored breathing.  Diminished breath  sounds at the very bases.  No rales.  CARDIAC:  A regular rate and rhythm.  No S3 gallop.  CHEST WALL:  Healing well.  There is no erythema or drainage around the  sternal incision.  EXTREMITIES:  Vein harvest sites on the right appear to be healing well.  There is only trace edema of the lower extremities.  Distal pulses are 1  to 2+.  SKIN:  Warm and dry.  MUSCULOSKELETAL:  No kyphosis noted.  NEUROPSYCHIATRIC:  The patient is alert and oriented x3.  Affect is  normal.   IMPRESSION AND RECOMMENDATIONS:  1. Left main/multivessel coronary artery disease with an ejection      fraction of approximately 40% at  echocardiography, now status post      coronary artery bypass grafting.  The patient seems to be      recuperating fairly well.  She is due to see Dr. Donata Clay back in      early May.  At this point, we will continue her present      medications.  She did manifest paroxysmal atrial fibrillation      following surgery and is continuing on amiodarone at low dose.  We      will add a CardioNet monitor to ensure that she is not continuing      to manifest breakthrough atrial arrhythmias.  If this is the case,      then I think Coumadin would be indicated and we would stop her      Plavix at that time.  Otherwise, if her rhythm remains stable, we      may be able to discontinue her amiodarone after approximately 6      weeks postoperatively and follow her from there.  She appears to      have a good volume status at this time.  I would like to see her      back in approximately 1 month and ultimately we will need to repeat      her echocardiogram to reassess left ventricular function following      revascularization.  2. Hyperlipidemia on Lipitor.  Will ultimately need fasting lipid      profile and liver function tests.     Jonelle Sidle, MD  Electronically Signed    SGM/MedQ  DD: 08/30/2007  DT: 08/30/2007  Job #: [id number]   cc:   Kerin Perna, M.D.  Ernestine Conrad, M.D.

## 2010-09-23 NOTE — Consult Note (Signed)
NAMEKENNI, NEWTON NO.:  [id number]   MEDICAL RECORD NO.:  [id number]          PATIENT TYPE:  INP   LOCATION:  2913                         FACILITY:  [facility]   PHYSICIAN:  Kerin Perna, M.D.  DATE OF BIRTH:  [birth year]   DATE OF CONSULTATION:  08/13/2007  DATE OF DISCHARGE:                                 CONSULTATION   PHYSICIAN REQUESTING CONSULTATION:  Everardo Beals. Juanda Chance, MD, [facility].   PRIMARY CARE PHYSICIAN:  Dr. Blinda Leatherwood in [place].   REASON FOR CONSULTATION:  Severe three vessel coronary artery disease  with unstable angina and subendocardial myocardial infarction.   CHIEF COMPLAINT:  Chest pain.   HISTORY OF PRESENT ILLNESS:  I was asked to evaluate this 60 year old  Hispanic female for potential multivessel surgical coronary  revascularization for recently diagnosed severe three vessel disease  with left main stenosis and moderate reduction of LV function.  The  patient presented with acute chest pain and shortness of breath with EKG  changes and positive cardiac enzymes at [facility].  Her chest x-  ray showed CHF and her EKG showed evidence of a DMI.  She was  transferred to [facility] for urgent cardiac catheterization on  April 3.  She was initially treated with nitroglycerin, beta-blocker and  morphine and was not given Plavix.  She was found to have a 70% stenosis  of the left main, total chronic occlusion of the right coronary, 90%  stenosis of the LAD in multiple areas with reduced flow and an 80%  stenosis of the ramus intermediate and circumflex marginal.  Her EF was  40% by cath and an LV gram was not done due to her elevated filling  pressures with PA pressures of 45/25.  She was transferred to the CCU  and placed on low dose dobutamine and Lasix diuresis.  Her cardiac  enzymes have peaked with a troponin of 83.4 and her CPK-MB has peaked at  145.  Today she is without chest pain in a sinus rhythm with PA  pressures of 44/24  and a cardiac index of 2.8-3.0 liters per meter  squared.  Because of her severe coronary anatomy and symptoms surgical  revascularization was recommended.  I reviewed her 2-D echo as well  which demonstrates inferior hypokinesia but no significant MR or TR.   PAST MEDICAL HISTORY:  1. Diabetes mellitus.  2. COPD with active smoking.  3. Obesity.  4. Peripheral neuropathy from her diabetes.  5. Status post total abdominal hysterectomy.  6. Allergy to PENICILLIN with rash.   HOME MEDICATIONS:  1. Omeprazole.  2. Altace.  3. Singulair.  4. DuoNeb.  5. Metoprolol.  6. Fluoxetine.  7. Mirapex.   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  She lives with her daughter.  She smokes a pack of  cigarettes daily and does not use alcohol.   FAMILY HISTORY:  Negative.   REVIEW OF SYSTEMS:  Her weight has been stable.  She has not had a  febrile illness recently or had a change in health until she  developed  her chest pain and unstable angina.  She has not been hospitalized in  some time and denies any recent episodes of bronchitis or URI this past  winter.  She tolerated her abdominal hysterectomy without any anesthetic  or bleeding or surgical complications.  She denies any significant falls  or trauma to her chest or lower extremities.  She denies vascular  symptoms of DVT, claudication or TIA.  She has had no history of stroke  or seizure.  She does have diabetes with a hemoglobin A1c of 6.1 and she  denies thyroid disease.  She denies bleeding disorder or prior blood  transfusions and her GI review is positive for GERD but negative  hepatitis, jaundice or blood per rectum.  She denies any dental  complaints or difficulty swallowing or symptoms of aspiration.   PHYSICAL EXAMINATION:  VITAL SIGNS:  The patient is 5 feet 2 inches and  weighs 180 pounds.  Blood pressure is 95/55, pulse 72 and regular,  respirations 18, saturation on 2 liters  is 97% and she has been  afebrile.  GENERAL APPEARANCE:  That of a middle-aged female in the CCU in no acute  distress.  She has a Swan-Ganz catheter in the right groin.  HEENT:  Exam is normocephalic.  Dentition adequate.  NECK:  Without JVD or mass or bruit.  LYMPHATICS:  Revealed no palpable supraclavicular adenopathy.  LUNGS:  Breath sounds are scattered rales.  CARDIAC:  Exam is regular rhythm without S3 gallop.  ABDOMEN:  Abdominal exam is soft without pulsatile mass but obese.  EXTREMITIES:  Reveal no clubbing, cyanosis or edema.  Peripheral pulses  are 2+ in all extremities.  She has had some surgical procedures in her  left forearm from orthopedic injuries as well as bilateral carpal tunnel  operations.  NEUROLOGICAL:  Is alert and oriented and nonfocal without motor deficit.   LABORATORY DATA:  I reviewed her coronary arteriograms and her 2-D echo  and chest x-rays.  She was admitted with significantly elevated cardiac  enzymes and a BNP of 1600 and pulmonary edema pattern on chest x-ray.  Her creatinine is normal at 1.0 and she has mild anemia with a  hematocrit of 33.  Her EF is approximately 40% with inferior wall  hypokinesia and she has no significant valvular insufficiency.   IMPRESSION AND PLAN:  The patient will be stabilized from her myocardial  infarction and diuresed and scheduled for multivessel surgical coronary  revascularization on Tuesday, April 7.  Her LAD is a poor target due to  diffuse diabetic disease but probably graftable.  Her ramus intermediate  and circumflex marginals are small vessels but graftable and the distal  right coronary although totally occluded and small is also a possible  target.  I explained the plan for surgery with the patient.  She  understands at this point and agrees to proceed to complete the  evaluation.  I would recommend further diuresis and pulmonary toilet  prior to her surgery in the next 72 hours.      Kerin Perna, M.D.  Electronically Signed     PV/MEDQ  D:  08/13/2007  T:  08/13/2007  Job:  [id number]

## 2010-09-23 NOTE — Assessment & Plan Note (Signed)
OFFICE VISIT   KATALEIA, QUARANTA  DOB:  [birth year]                                        Sep 09, 2007  CHART #:  [id number]   CURRENT PROBLEMS:  1. Status post coronary artery bypass graft x4 on August 16, 2007, for      class IV unstable angina status post inferior wall myocardial      infarction.  2. Chronic obstructive pulmonary disease with active smoking, recently      reformed.  3. Diabetes mellitus.  4. ALLERGY TO PENICILLIN.   PRESENT ILLNESS:  Ms. Crabtree is a 60 year old Hispanic female who  presented with unstable angina and severe coronary disease with left  main stenosis and moderate LV dysfunction.  She was cathed by Dr. Juanda Chance  and then subsequently underwent multivessel bypass grafting, utilizing  the left IMA to the LAD and vein grafts to the ramus intermediate,  circumflex marginal and posterior descending.  Postoperatively, she had  some pulmonary issues due to her COPD, but was ultimately weaned from  oxygen.  She had arrhythmias with atrial fibrillation as well as sick  sinus syndrome which required adjustment of her medications and  observation for a few extra days.  Her diabetes was maintained well on  oral meds and she was subsequently discharged home on Altace 10 mg,  Ambien 10 mg, Prozac 20 mg daily, metformin 1 gram p.o. b.i.d., Lipitor  20 mg a day, aspirin 81 mg a day, Plavix 75 mg a day for a LAD  endarterectomy, glipizide 5 mg b.i.d. and Prilosec 20 mg a day.  She has  done well at home, has not resumed smoking.  She has no symptoms of CHF  or angina.  Her surgical incisions are healing well.  She is stronger  and is walking daily.   PHYSICAL EXAMINATION:  Blood pressure 130/80, pulse 70, respirations 18,  saturation 97% on room air.  She is alert and comfortable.  Breath  sounds are clear and equal.  The sternum is stable and well-healed.  Cardiac rhythm is regular without gallop or rub.  Her leg incisions are  well-healed.  There is no peripheral edema.   A rhythm strip taken today shows her to me in a sinus rhythm.   A PA and L chest x-ray today shows clear lung fields with minimal  atelectasis at the bases and a stable cardiac silhouette with intact  sternal wires.  There is no pleural effusion.   IMPRESSION AND PLAN:  The patient has done well now 1 month following  coronary bypass grafting for multivessel coronary disease.  She will  continue her current medications and follow up with Dr. Diona Browner in the  [facility] office.  She is not inclined to proceed with  phase II cardiac rehab, but will try to walk daily 15-20 minutes.  I  provided her with one more prescription for some pain medication and she  will return here as needed.   Kerin Perna, M.D.  Electronically Signed   PV/MEDQ  D:  09/09/2007  T:  09/09/2007  Job:  [id number]   cc:   Jonelle Sidle, MD

## 2010-09-23 NOTE — Assessment & Plan Note (Signed)
[facility]                          [facility] OFFICE NOTE   [name]                     MRN:          [id number]  DATE:03/13/2008                            DOB:          1950-12-24    PRIMARY CARE PHYSICIAN:  Diana Conrad, MD   REASON FOR VISIT:  Scheduled followup.   HISTORY OF PRESENT ILLNESS:  I saw [name] back in October.  At  that time she underwent medication adjustments and I was concerned about  increasing fluid weight and shortness of breath.  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  I referred her for a noncontrasted CT scan of the  chest.  This particular study did not describe any problems with sternal  nonunion.  In addition, she had no evidence of pleural effusion or  edema.  Laboratory data revealed a BNP level of 1249 with a BUN and  creatinine of 14 and 1.0 and LDL of 126.  This was on an intensified  dose of lisinopril/hydrochlorothiazide.  She had followup BNP obtained 2  weeks later with an increase to 1979 and it was recommended following  review by [name] that Lasix 40 mg daily be added.  It is not clear to  me that any medication changes were made however, and she comes back to  the office now for followup remaining short of breath.  Her weight is up  9 more pounds since last month.  She reports no significant increase in  her eating.  She does have NYHA class III dyspnea on exertion and  resolving sternal pain.  She reports compliance with her medications,  although did not bring them in for review.  I reviewed the recent  testing with the patient and her daughter and discussed the possibility  of either continuing to manage her medically as an outpatient versus  admission to the hospital for further assessment.  At this point she  prefers outpatient management presuming we can start to see some  clinical improvement over  the next few weeks.   ALLERGIES:  PENICILLIN.   PRESENT MEDICATIONS:  1. Atenolol 12.5 mg p.o. daily.  2. Lipitor 20 mg p.o. nightly.  3. Lisinopril, hydrochlorothiazide 20/25 mg p.o. daily.  4. Coumadin as read by [facility].  5. Mirapex 0.25 mg p.o. daily.  6. Enteric-coated aspirin 81 mg 2 tablets p.o. daily.  7. Singulair 10 mg p.o. daily.  8. Glipizide 5 mg p.o. b.i.d.  9. Omeprazole 20 mg p.o. daily.  10.Metformin 1000 mg p.o. b.i.d.  11.Fluoxetine 20 mg p.o. daily.  12.Ambien 10 mg p.o. nightly.   REVIEW OF SYSTEMS:  As described in history of present illness.  Otherwise negative.   PHYSICAL EXAMINATION:  VITAL SIGNS:  Blood pressure today is 125/75,  heart rate is 46 and regular, weight is 199 pounds, up from 190 pounds  at her last visit.  GENERAL:  This is an obese, chronically ill-appearing woman in no acute  distress.  HEENT:  Conjunctiva is normal.  Oropharynx clear.  Poor dentition.  NECK:  Supple.  Increased jugular venous pressure noted.  No bruits.  LUNGS:  Exhibit diminished breath sounds, but fairly clear, slight  expiratory wheeze noted.  CARDIAC:  Regular rate and rhythm.  No pericardial rub.  No pathologic  systolic murmur.  Chest wall is stable with midsternal gap, although  there is no obvious movement or click on palpation.  ABDOMEN:  Soft, obese.  Bowel sounds present.  EXTREMITIES:  Exhibit fairly chronic 1+ edema venous stasis also noted.  MUSCULOSKELETAL:  No kyphosis noted.  NEUROPSYCHIATRIC:  The patient is alert and oriented x3.  Affect is  appropriate, but flat.   IMPRESSION AND RECOMMENDATIONS:  Evidence of continued volume overload  despite medication adjustments.  [name] reports no increase in  her dietary intake of fluids or calories.  Her BNP level reflects volume  overload as well.  She would prefer to be managed as an outpatient at  this point and our plan will be to add Lasix 40 mg p.o. b.i.d.,  potassium 20 mEq p.o.  daily, and stop the hydrochlorothiazide switching  her back to lisinopril 20 mg daily alone.  We will also stop her  atenolol given bradycardia and add Advair 1 puff twice daily which she  had been taking previously for history of asthma.  She has had some  wheezing.  If heart rate comes up at some point, would prefer Coreg.  A  BMET and BNP will be obtained in 2 weeks with an office followup and we  will decide at that point the next step.  She has a documented left  ventricular ejection fraction of 40% based on prior assessment.  I also  agree with the recent addition of statin therapy given her LDL  cholesterol of 126.  Further plans to follow.     Diana Sidle, MD  Electronically Signed    SGM/MedQ  DD: 03/13/2008  DT: 03/13/2008  Job #: [id number]   cc:   Diana Perone MD [name]

## 2010-09-23 NOTE — Discharge Summary (Signed)
NAMEANASTASIJA, [name] NO.:  [id number]   MEDICAL RECORD NO.:  [id number]          PATIENT TYPE:  INP   LOCATION:  2019                         FACILITY:  [facility]   PHYSICIAN:  Kerin Perna, M.D.  DATE OF BIRTH:  [birth year]   DATE OF ADMISSION:  08/12/2007  DATE OF DISCHARGE:                               DISCHARGE SUMMARY   ADDENDUM  This concerns the cath note helping out little bit on the fact that the  patient is coming Tuesday, April 21 for a follow-up. Her ejection  fraction at [facility] was 40% and echocardiogram showing inferior and  posterior wall akinesis.  She has severe three-vessel coronary artery  disease at catheterization on April 3 with left main involvement and  basically required dobutamine after the procedure and a close follow-up  with cardiac surgery.      Maple Mirza, [place]      Kerin Perna, M.D.  Electronically Signed    GM/MEDQ  D:  08/26/2007  T:  08/26/2007  Job:  [id number]   cc:   Jonelle Sidle, MD

## 2010-09-23 NOTE — Assessment & Plan Note (Signed)
[facility]                          [facility]ICE NOTE   [name], [name]                     MRN:          [id number]  DATE:03/29/2008                            DOB:          [birth year]    PRIMARY CARDIOLOGIST:  Jonelle Sidle, MD   REASON FOR VISIT:  Scheduled followup.  Please refer to Dr. Ival Bible  most recent clinic note of March 13, 2008, for full details.   At that time, [name] was started on diuretic therapy with Lasix  40 mg b.i.d., for treatment of volume overload and known depressed LV  function (EF of 40%).  She was also taken off atenolol, given documented  bradycardia with a heart rate of 46.  She now returns for followup.   The patient presents with marked improvement in her symptoms.  She has  diuresed 14 pounds.  She is tolerating her medications.  She is  reporting no PND, orthopnea, and has no further swelling of her lower  extremities.   The patient does refer to development of nonconfluent healing of her  sternum, which reportedly developed some time after she was released  from [facility] this past April.  Dr. Diona Browner did order a CT scan of  the chest for further clarification of this, and the study did not  indicate any complications of the median sternotomy.  There was also no  finding of acute infiltrate, pleural effusion, or pulmonary edema.   Clinically, [name] is doing quite well since the recent addition  of diuretic therapy.  Followup blood work showed marked improvement in  the BNP level from a previous level of nearly 2000, to a current level  of 260.  Renal function has remained stable with a creatinine of 1.1.  She does have some mild hyponatremia, however, with a current level of  130, down from 132.   CURRENT MEDICATIONS:  1. Lasix 40 b.i.d.  2. Potassium 20 daily.  3. Lisinopril 20 daily.  4. Lipitor 20 daily.  5. Metformin 1000 b.i.d.  6. Omeprazole.  7.  Singulair.  8. Aspirin 162 daily.  9. Mirapex.  10.Coumadin 5 mg as directed.   PHYSICAL EXAMINATION:  VITAL SIGNS:  Blood pressure 127/84, pulse 84 and  regular, weight 185 (down 14).  GENERAL:  [name], moderately obese, sitting upright, in no  distress.  HEENT:  Normocephalic, atraumatic.  NECK:  No JVD.  LUNGS:  Clear to auscultation in all fields.  HEART:  Regular rate and rhythm.  No significant murmurs.  No rubs.  ABDOMEN:  Protuberant, nontender.  EXTREMITIES:  No pedal edema.  NEURO:  No focal deficits.   IMPRESSION:  1. Ischemic cardiomyopathy.      a.     Severe 3-vessel CAD/left main disease treated with 4-vessel       CABG, April 2009.      b.     Status post NSTEMI.      c.     Moderate left ventricular dysfunction (EF 40%).  2. Chronic systolic heart failure.      a.  Currently euvolemic.  3. Postop atrial fibrillation.      a.     Maintaining NSR.      b.     Initially treated with amiodarone.      c.     Chronic Coumadin; CHAD2 score:  3.  4. Hyponatremia.  5. Type 2 diabetes mellitus.  6. Dyslipidemia.  7. History of tobacco, since discontinued.  8. Hypertension.   PLAN:  1. Down titrate Lasix to 40 mg daily as her maintenance dose.  This is      particularly given the fact that her hyponatremia has worsened      somewhat, to her current level of 130.  We will check a followup      BMET in 1 week, as well as a repeat BNP level for monitoring of her      volume status.  2. Down titrate aspirin from 162 to 81 mg daily, particularly given      that she is to remain on Coumadin indefinitely.  3. Initiate beta-blocker treatment with Coreg 3.125 b.i.d.  We will      have her return for blood pressure/pulse check in 1 week, given her      previously documented sinus bradycardia on atenolol.  4. Initiate cardiac rehab.  5. Recommend referral back to Dr. Kathlee Nations Tright in [place], for      a second opinion regarding possible nondisplaced  sternum.  6. Recommend surveillance FLP/LFT profile in 12 weeks.  7. Schedule return clinic followup with myself and Dr. Diona Browner in 3      months.      Diana Searing, PA-C  Electronically Signed      Jonelle Sidle, MD  Electronically Signed   GS/MedQ  DD: 03/29/2008  DT: 03/30/2008  Job #: [id number]   cc:   Ernestine Conrad, MD

## 2010-09-23 NOTE — Assessment & Plan Note (Signed)
[facility]                          [facility] OFFICE NOTE   [name]                     MRN:          [id number]  DATE:10/29/2008                            DOB:          07-14-1950    PRIMARY CARDIOLOGIST:  Jonelle Sidle, MD   REASON FOR VISIT:  Post hospital followup.   Since her last visit here on May 4, [name] returned to [facility] with complaints of progressive dyspnea and weight gain.  She  reported compliance with her medications.  She presented to the ED, and  was noted to be in atrial flutter (type 1) with rapid ventricular  response.  Of note, she had been recently taken off Coumadin earlier in  the year, secondary to gastrointestinal hemorrhage and progressive  anemia.  She had undergone extensive workup, including a small capsule  endoscopy, by Dr. Karilyn Cota, which was inconclusive secondary to retained  food remnant in her ileum.  She had heme positive stools (2/3).  She  also had a tagged RBC study, which was negative.  Her hemoglobin  remained stable and she did not require transfusion.  Her discharge  hemoglobin level was 10.1, with hematocrit of about 30.   The patient was seen in consultation by Dr. Myrtis Ser and Dr. Andee Lineman.  She  was placed on amiodarone 400 mg daily, per Dr. Margarita Mail recommendation,  and was discharged on this amount.  Of note, however, she was also on  Cardizem at that time, which had been recently increased from 180 to 240  mg daily, per Dr. Ival Bible office visit, for better rate control.   The patient also was treated for acute/chronic systolic heart failure  with IV Lasix.   Final recommendations, per Cardiology, were to consider elective atrial  flutter ablation, with a short course of Coumadin of approximately 30  days.  This would have to be in the setting of close monitoring of her  hemoglobin levels, however.  Of note, there was initial consideration  for a TEE -  guided cardioversion during this most recent  hospitalization.  However, this was cancelled following the finding of  heme-positive stools.   Clinically, [name] reports continued fatigue and, in fact, states  that her shortness of breath has worsened in the past 2 days.  She also  notes an increase in her weight, as well as development of lower  extremity edema.  She sleeps propped up on several pillows, and also  reports some PND.   [name] states that she has not had any followup blood work since  her discharge of approximately 1 month ago.   Electrocardiogram in our office today indicates narrow complex rhythm at  approximately 60 bpm, either junctional rhythm or sinus bradycardia with  indistinct P waves.   CURRENT MEDICATIONS:  1. Amiodarone 200 mg b.i.d.  2. Cardizem 420 mg daily, in divided doses (patient incorrectly taking      both 180 mg and 240 mg doses each day).  3. Lasix 40 mg b.i.d.  4. Metformin 500 mg b.i.d.  5. Levothyroxine 0.05 daily.  6. Singulair.  7. Poly iron 150 b.i.d.  8. Carvedilol 6.25 b.i.d.  9. Novolin 70/30 20 units b.i.d.  10.Aldactone 25 b.i.d.  11.Advair.  12.Fluoxetine 20 daily.  13.Lipitor 20 daily.  14.Ambien 10 nightly.  15.Iron sulfate 325 daily.  16.Lantus 30 units b.i.d.   PHYSICAL EXAMINATION:  VITAL SIGNS:  Blood pressure 132/71, pulse 57  regular, weight 232 (up 18).  GENERAL:  [name], morbidly obese, sitting upright, no  distress.  HEENT:  Normocephalic, atraumatic.  NECK:  Palpable carotid pulse without bruits; jugular venous distention  noted 90 degrees.  LUNGS:  Clear to auscultation throughout.  HEART:  Regular rate and rhythm, decreased heart sounds, with no  significant murmurs.  ABDOMEN:  Soft, nontender.  EXTREMITIES:  1+ bilateral lower extremity edema (right greater than  left).  NEURO:  Flat affect, but no focal deficit.   IMPRESSION:  1. Atrial flutter (Type 1) with rapid  ventricular response, resolved      at this point.      a.     Currently, a junctional escape rhythm versus sinus       bradycardia.      b.     Status post recent amiodarone load at 400 mg daily.      c.     History of paroxysmal atrial fibrillation (postop).      d.     [place] 2 score:  3, previously on Coumadin.  2. History of gastrointestinal bleed.      a.     Status post extensive workup with no discernible focus for       the bleeding.  3. Chronic anemia, stable.  4. Chronic systolic heart failure.      a.     EF 45-50%.  5. Multivessel coronary artery disease.      a.     Status post 4-vessel CABG, April 2009.  6. Status post fibrous malunion of the sternotomy.  7. IDDM.  8. Hypertension.  9. Dyslipidemia.  10.Mild, chronic renal insufficiency.   PLAN:  1. Adjust medications as follows:  Down titrate amiodarone to 200 mg      daily.  Down titrate Cardizem back to the previous dose of 180 mg      daily.  Continue Coreg.  2. A 48-hour Holter monitor for further assessment of the current      arrhythmia and heart rate variability.  3. Followup blood work with a CMET, CBC, and BNP level.  4. Early clinic followup with myself and Dr. Diona Browner over the next 2      weeks.  Repeat EKG at that time.  We will reconsider referring her      to our EP team in [facility] for consideration of possible elective      radiofrequency ablation.  Dr. Diona Browner did discuss the question of      limited Coumadin use for the patient with Dr. Linna Darner by phone today.      Dr. Linna Darner indicated that he would not be opposed to a 4 week course      with close (weekly CBC) follow-up, realizing that she would still      be at risk for recurrent bleeding.      [name], PA-C  Electronically Signed      Jonelle Sidle, MD  Electronically Signed   GS/MedQ  DD: 10/29/2008  DT: 10/30/2008  Job #: [id number]   cc:   Erasmo Downer, MD

## 2010-09-23 NOTE — Assessment & Plan Note (Signed)
OFFICE VISIT   [name], [name]  DOB:  07-16-1950                                        May 25, 2008  CHART #:  [id number]   CURRENT PROBLEMS:  1. Fibrous malunion of the sternotomy, status post urgent coronary      artery bypass graft x4 in April 2009.  2. Preoperative DMI with ejection fraction of 35-40%.  3. Chronic obstructive pulmonary disease, reformed smoker.  4. Obesity.  5. Diabetes mellitus.  6. Allergy to penicillin.   PRESENT ILLNESS:  The patient returns to discuss sternal rewiring.  She  was hospitalized approximately 3 weeks ago for GI bleed, apparently  gastritis from nonsteroidals, Plavix, and Coumadin.  She has been on  Coumadin for history of paroxysmal atrial fibrillation and followed by  Dr. Diona Browner at the [facility] office.  She states her  stools are now not black.  She did require a single unit of transfusion.  She was hospitalized at [facility] for upper GI bleed.  Her  NSAIDS, Plavix, and aspirin have been stopped.  She still, however, is  taking a Coumadin.  She has a productive cough at this time.  She has no  symptoms of CHF or angina.  Her sternal incision healed nicely following  surgery and later became slightly separated with a gap of 3-4 mm  palpable on exam.  She, however, has completely stopped smoking.   Spirometry in office today indicated FEV-1 of 1.4 which is 60% of  predicted.  Her oxygen saturation is 95-99%.  Her chest x-ray shows some  COPD with cardiomegaly.  No pleural effusion and there are no broken  sternal wires.   PHYSICAL EXAMINATION:  VITAL SIGNS:  Blood pressure 130/60, pulse 60 and  by a rhythm strip she is in sinus rhythm, respirations 18, and  saturation 99%.  LUNGS:  Breath sounds are clear.  CARDIAC:  Rhythm is regular without murmur or gallop.  EXTREMITIES:  She has no peripheral edema.  ABDOMEN:  She is obese.  CHEST:  Sternum is with a fibrous malunion and a  separation of a few  millimeters between the 2 halves of the sternum.   IMPRESSION AND PLAN:  The patient has significant discomfort and breast  pain from the sternal malunion.  She would benefit from elective sternal  rewiring.  However, she needs time for gastrointestinal bleeding to  resolve and her stomach to heal.  She also needs to stop her Coumadin  before this elective operation.  We also need to assess her cardiac and  left ventricular function with an echo.   PLAN:  I will see her back in 2 weeks after she has stopped her Coumadin  and has an echo at Dr. Ival Bible office in [place] to potentially schedule  her surgical date.  We will plan on providing her with some inhalers as  well as probable Tussionex to suppress her cough following this surgery.   Kerin Perna, M.D.  Electronically Signed   PV/MEDQ  D:  05/25/2008  T:  05/25/2008  Job:  [id number]   cc:   Erasmo Downer, MD  Jonelle Sidle, MD

## 2010-09-23 NOTE — Cardiovascular Report (Signed)
NAMEVEARL, AITKEN            ACCOUNT NO.:  [id number]   MEDICAL RECORD NO.:  [id number]          PATIENT TYPE:  INP   LOCATION:  2921                         FACILITY:  [facility]   PHYSICIAN:  Everardo Beals. Juanda Chance, MD, FACCDATE OF BIRTH:  [birth year]   DATE OF PROCEDURE:  DATE OF DISCHARGE:                            CARDIAC CATHETERIZATION   HISTORY:  [name] is a 60 year old and has diabetes and chronic  obstructive lung disease with continued cigarette use.  She had a bad  episode of chest pain a week ago, and she had recurrent pain and  presented to [facility] last night and was transferred to [facility]  this morning with positive enzymes consistent with a non-ST-elevation  myocardial infarction.  She had an echocardiogram earlier today which  showed an ejection fraction of 40% and inferior and posterior wall  akinesis.   PROCEDURE:  Right heart catheterization was performed percutaneously via  right femoral vein using venous sheath and Swan-Ganz dilution catheter.  Left heart catheterization was performed percutaneously through the  right femoral artery using arterial sheath and 5-French preformed  coronary catheters.  A front wall arterial puncture was performed,  Omnipaque contrast was used.  Because of the very high a wedge pressure,  she was treated with intravenous Lasix during the procedure.  She also  received Versed and Fentanyl, but her sats had dropped and she became  more somnolent after the Versed, so this was reversed.  Overall, she  tolerated well.  Left laboratory in stable but still critical condition.   RESULTS:  Left main coronary:  Left main coronary had a 70% distal  stenosis at the bifurcation.   Left anterior descending artery:  Left anterior descending artery was  diffusely and heavily calcified and was diffusely diseased throughout  its course.  There were multiple 90% stenoses in the proximal mid and  distal vessel.  The vessel gave rise to four  moderately large septal  perforators and a small and larger diagonal branch.   The circumflex artery:  The circumflex artery gave rise to a ramus  branch, marginal branch and posterolateral branch.  There was 80%  narrowing in the proximal circumflex artery.  There was focal 80%  stenosis in the ramus branch.  The marginal branch was totally occluded  and filled via collaterals.   The right coronary artery:  The right coronary artery was completely  occluded at its midportion.  The distal right coronary filled by  collaterals from left coronary artery consistent with posterior  descending and two posterolateral branches.   A subclavian injection:  The subclavian injection showed patent  subclavian artery and patent vertebral and mammary arteries.   A distal aortogram.  Distal aortogram was performed which showed patent  renal arteries and no significant aortic obstruction.   HEMODYNAMIC DATA:  The right atrial pressure was 20 mean.  The pulmonary  artery pressure was 56/22 with mean of 37.  Pulmonary wedge pressure was  28 mean.  Left ventricular pressure was 106/33 and aortic pressure was  106/62 with mean of 81.  Cardiac output/cardiac index was 3.4/2.2  liter/minute/meter squared by  Fick.  Pulmonary artery saturation was  51%.  Aortic saturation was 92%.   CONCLUSION:  1. Severe left main and three-vessel coronary artery disease with 70%      narrowing in the distal left main, multiple 90% stenosis in the      proximal mid and distal left anterior descending artery with      diffuse calcified disease, 80% stenosis in the proximal circumflex      artery with 80% stenosis in the ramus branch and total occlusion of      the marginal branch, and total occlusion of the right coronary      artery.  2. Inferior and posterior wall akinesis by echocardiography with an      estimated ejection fraction of 40%.  3. Significantly elevated pulmonary artery wedge pressure of 28 and       pulmonary artery pressure of 56/22 with mean of 37.   RECOMMENDATIONS:  The patient is critically ill with severe left main  and three-vessel coronary disease.  I will plan to leave the Swan in and  continued diuresis.  I have asked Dr. Donata Clay to see her in  consultation for possible surgery, although her targets are suboptimal.  Her other comorbidities include diabetes and chronic obstructive  pulmonary disease with continued cigarette use.  Will also start her on  dobutamine.      Bruce Elvera Lennox Juanda Chance, MD, [facility]  Electronically Signed     BRB/MEDQ  D:  08/12/2007  T:  08/13/2007  Job:  [id number]   cc:   Jonelle Sidle, MD  Cardiopulmonary Laboratory

## 2010-09-23 NOTE — Discharge Summary (Signed)
NAMEHALLELUJAH, WYSONG NO.:  [id number]   MEDICAL RECORD NO.:  [id number]          PATIENT TYPE:  INP   LOCATION:  2019                         FACILITY:  [facility]   PHYSICIAN:  Kerin Perna, M.D.  DATE OF BIRTH:  Oct 05, 1950   DATE OF ADMISSION:  08/12/2007  DATE OF DISCHARGE:  08/26/2007                               DISCHARGE SUMMARY   ADDENDUM:  This patient was admitted on August 12, 2007, discharging August 26, 2007,  after recover from coronary artery bypass graft surgery.   Patient has allergies to PENICILLIN and was found at catheterization to  have  respiratory depression on low-dose of Versed.   FINAL DIAGNOSES:  1. Paroxysmal atrial fibrillation after coronary artery bypass graft      surgery.      a.     This arrhythmia started postoperative day #2.  2. Profound bradycardia with beta blocker therapy for her atrial      fibrillation.  3. Patient discharging on Amiodarone 200 mg daily.      a.     Paroxysms of atrial fibrillation are slower on Amiodarone.      b.     Patient had received about 4 g orally at the time of       discharge.  4. Electrophysiology consult.      a.     Bradycardia likely to become more problematic as her       Amiodarone continues to load.      b.     Patient has a CHADS score of 3.  This represents a 6%       annualized risk, favors Coumadin.      c.     Atrial fibrillation may self terminate as healing of       coronary artery bypass graft surgery progresses.   PLAN:  1. Amiodarone for a 6 week period after discharge.  2. The patient will pick up a LifeWatch or other monitor to check for      bradycardia at office visit [facility].  3. Coumadin will start at office visit [facility] April 21.  4. She will see Dr. Diona Browner at 9:30 April 21.   MEDICATIONS AT DISCHARGE:  1. Altace 10 mg daily.  2. Ambien 10 mg daily at bedtime.  3. Fluoxetine 20 mg daily.  4. Arthrotec 1 tab twice daily.  5. Metformin  twice daily.  6. Mirapex 0.25 mg twice daily.  7. Lovastatin 40 mg twice daily.  8. Singulair 10 mg daily.  9. Nabumetone 1 g daily.  10.Omeprazole 20 mg daily.  11.Glipizide 5 mg daily.  12.Plavix 75 mg daily.  13.Enteric-coated aspirin 81 mg daily.  14.Amiodarone 200 mg daily.  15.Lipitor 20 mg at bedtime replaced as lovastatin .      Maple Mirza, [place]      Kerin Perna, M.D.  Electronically Signed    GM/MEDQ  D:  08/26/2007  T:  08/26/2007  Job:  [id number]   cc:   Learta Codding, MD,FACC  Dr. Diona Browner

## 2010-09-23 NOTE — Assessment & Plan Note (Signed)
OFFICE VISIT   [name], [name]  DOB:  12-04-1950                                        April 20, 2008  CHART #:  [id number]   CURRENT PROBLEMS:  1. Fibrous malunion of the sternotomy incision, status post urgent      coronary artery bypass graft x4 with coronary endarterectomy for      unstable angina, April 2009.  2. Obesity.  3. Chronic obstructive pulmonary disease with history of smoking.  4. Diabetes mellitus on Lantus insulin.  5. Allergy to penicillin.   PRESENT ILLNESS:  I was asked to evaluate the patient for sternal  incisional pain after prior urgent coronary bypass surgery earlier this  year.  She states she feels a popping sensation in her chest and has  significant constant pain in her chest and breasts.  Her sternal  incision healed well after surgery.  Recent CT scan of the chest showed  slight separation of the sternal halves in the upper midportion of her  sternal incision.  She has no residual pleural effusion, pericardial  effusion, or evidence of a retrosternal infection or fluid collection.  Her postoperative office visit with chest x-ray in May showed no  evidence of sternal wire displacement or sternal instability.   PHYSICAL EXAMINATION:  Vital Signs:  Blood pressure 130/60, pulse 16 and  regular, respirations 18, saturation 100%.  Lungs:  Breath sounds are  clear.  Chest:  The sternum is well healed, but there is a space in  between the sternal halves and some instability.  Cardiac:  Rhythm is  regular.  There is no murmur or gallop.  Extremities:  Her leg incisions  are healed and there is minimal pedal edema.  Neurologic:  Intact.   PRESENT MEDICATIONS:  Atenolol 12.5 mg a day, Lipitor 20 mg a day,  lisinopril HCT 20/25 mg daily, Coumadin as directed, Mirapex, aspirin,  Lantus insulin, Singulair 10 mg, Lasix 40 mg b.i.d., potassium 20 mEq  b.i.d., and fluoxetine 20 mg daily.   PLAN:  The patient wishes to be  scheduled for sternal rewiring after the  holidays.  I will see her back in mid January to schedule the date of  surgery.   Kerin Perna, M.D.  Electronically Signed   PV/MEDQ  D:  04/20/2008  T:  04/21/2008  Job:  [id number]

## 2010-09-23 NOTE — Discharge Summary (Signed)
[name], [name] NO.:  [id number]   MEDICAL RECORD NO.:  [id number]          PATIENT TYPE:  INP   LOCATION:  2019                         FACILITY:  [facility]   PHYSICIAN:  Kerin Perna, M.D.  DATE OF BIRTH:  1951-04-20   DATE OF ADMISSION:  08/12/2007  DATE OF DISCHARGE:  08/26/2007                               DISCHARGE SUMMARY   ADDENDUM   HOSPITAL COURSE STAY:  The patient's discharge was held on August 24, 2007, because an EP consult with [facility] was obtained.  As  stated previously, the patient had postop bradycardia and required  epicardial pacing.  She also, postop, went into paroxysmal atrial  fibrillation with RVR.  The patient currently is on amiodarone 200 mg  p.o. twice daily; however, it had been recommended that the patient be  discharged on 200 mg p.o. daily.  Regarding the bradycardia, the  patient's Lopressor was stopped on August 24, 2007.  Currently, on this  postop day, the patient has no complaints, would like to go home.  She  has a T-max, 99.9. Heart rate was 50-60s, early in the morning; however  at the time of being seen, was in the 90s to low in 100s, plus she was  in atrial fibrillation.  BP was 114/61, O2 sat was in the 90% on room  air.  Preoperative weight was 82 kg, today's weight is 80.3 kg.  CBG is  103 and 135, respectively.   PHYSICAL EXAMINATION:  CARDIOVASCULAR:  Irregular rate and rhythm.  PULMONARY:  Clear to auscultation bilaterally.  No wheeze, rale, or  rhonchi.  ABDOMEN:  Soft and nontender.  Positive bowel sounds throughout.  EXTREMITIES:  Trace edema, right lower extremity.  Right lower extremity  wound and sternal wounds are well healed.  No signs of infection.  Sternum is solid.   The patient is going to be discharged today, only change to medication,  as previously stated.  Then, amiodarone has been decreased from 200 mg  p.o. twice daily to 200 mg p.o. daily, and per Dr. Donata Clay, the  patient  will be discharged home on Plavix and enteric-coated aspirin and  not Coumadin.  The patient already has a followup to see Dr. Donata Clay  on Sep 09, 2007.  She will also see [facility] on August 30, 2007,  as well as an appointment with Dr. Blinda Leatherwood, her primary care physician.      [name], [place]      Kerin Perna, M.D.  Electronically Signed    DZ/MEDQ  D:  08/26/2007  T:  08/27/2007  Job:  [id number]

## 2010-09-23 NOTE — Assessment & Plan Note (Signed)
[facility]                          [facility] OFFICE NOTE   [name], [name]                     MRN:          [id number]  DATE:07/16/2008                            DOB:          1950/06/28    PRIMARY CARE PHYSICIAN:  Erasmo Downer, MD   REASON FOR VISIT:  Post-hospital followup.   HISTORY OF PRESENT ILLNESS:  [name] was seen in the office back  in January by Mr. Serpe.  Her history is well detailed in the previous  note.  In the interim, she was referred back to Dr. Morton Peters secondary  to chest pain associated with sternal malunion.  She was ultimately  taken to the operating room on June 27, 2008, for sternal revision.  It was noted that the sternal wires were intact; however, there was a  defect in the soft tissue pectoralis muscle which was reconstructed.  She was discharged from the hospital on June 29, 2008, after a brief  postoperative recovery.  She did have a brief episode of  supraventricular tachycardia with heart rates in the 140s-150s, but this  resolved spontaneously without other specific intervention.  She was  seen by Dr. Morton Peters just recently on July 13, 2008, and felt to be  healing well with plans to return to Cardiac Rehabilitation and lifting  restrictions of no more than 20 pounds.  Her PA and lateral chest x-ray  at that time is described as showing intact sternal wires with no other  active pulmonary disease process.   The patient states that symptomatically her chest feels much better  following this intervention.  She is not reporting any specific angina.  She does have some fatigue and dyspnea with exertion.  I note her to be  fairly bradycardic on today's exam.  Her Coreg has somehow been up  titrated from 3.125 mg twice daily to 12.5 mg twice daily since I last  saw her.  Electrocardiogram today shows sinus bradycardia at 48 beats  per minute with sinus arrhythmia and inferior Q-waves  consistent with  previous infarction.  She did have an echocardiogram done back in  January, which showed improvement in the left systolic function to 50%  associated with inferior wall hypokinesis.  She does have mildly reduced  right ventricular systolic function, but only trace tricuspid  regurgitation and a normal right ventricular systolic pressure of 28  mmHg.  She has chronic swelling usually involving her abdomen, less so  her lower extremities.  She continues on diuretic therapy.   ALLERGIES:  PENICILLIN and NONSTEROIDAL ANTI-INFLAMMATORY DRUGS  (gastrointestinal hemorrhage).   PRESENT MEDICATIONS:  1. Lasix 40 mg p.o. b.i.d.  2. Metformin 500 mg p.o. b.i.d.  3. Tramadol 50 mg p.o. t.i.d.  4. Advair 250/50 one puff b.i.d.  5. Fluoxetine 20 mg p.o. q.a.m.  6. Prevacid 30 mg p.o. q.a.m.  7. Singular 10 mg p.o. q.a.m.  8. Mirapex 0.25 mg p.o. at bedtime.  9. Lisinopril 20 mg p.o. q.a.m.  10.Lipitor 20 mg p.o. daily.  11.Ambien 10 mg p.o. at bedtime.  12.Multivitamin one p.o. daily.  13.Synthroid 25 mcg  p.o. daily.  14.Ferrous sulfate 325 mg p.o. daily.  15.Lantus 30 units subcu b.i.d.  16.Carvedilol 12.5 mg p.o. b.i.d.  17.Benadryl 25 mg p.r.n.   REVIEW OF SYSTEMS:  As outlined above.  She has had no speech deficits,  focal weakness, or significant palpitations.  Otherwise negative.   PHYSICAL EXAMINATION:  VITAL SIGNS:  Blood pressure is 133/67, heart  rate is 48 and regular, weight is 207.2 pounds, up from 197 at her last  visit in January.  GENERAL:  This is a chronically ill-appearing but in no acute distress.  HEENT:  Conjunctivae and lids are normal.  NECK:  No loud carotid bruits.  No thyromegaly.  LUNGS:  Essentially clear with diminished breath sounds at the bases.  The chest wall shows a well-healing mid sternal incision.  No erythema  or drainage.  CARDIAC:  Regular rate and rhythm.  No S3 gallop.  No pathologic  systolic murmur.  ABDOMEN:  Protuberant  but nontender.  Bowel sounds are present.  EXTREMITIES:  Trace edema that is symmetrical.   IMPRESSION AND RECOMMENDATIONS:  1. Status post recent sternal revision by Dr. Morton Peters secondary to      malunion with good symptomatic results.  2. Coronary artery disease status post 4-vessel coronary artery bypass      grafting in April 2009 with an ejection fraction of 50% and      inferior wall hypokinesis.  Symptomatically, she is stable.  I      would like to decrease Coreg to 6.25 mg p.o. b.i.d. given her      bradycardia and fatigue with exertion and concurrently increase      lisinopril to 30 mg daily for more aggressive blood pressure      control.  I agree with returning to the Cardiac Rehabilitation      Program and this is scheduled to begin next week.  Otherwise, I      will plan to see her back over the next 3 months.  3. History of paroxysmal atrial fibrillation with a CHADS-2 score of      3.  She was previously on Coumadin, although this was discontinued      after our last visit in January given her upcoming surgery and also      with concerns about previous gastrointestinal hemorrhage with a      hemoglobin down to 7.9.  She is not reporting any active bleeding      now and we will plan to revisit anticoagulation with her down the      road.  4. Hypertension, not optimally controlled.  Medication adjustments are      planned.    Jonelle Sidle, MD  Electronically Signed   SGM/MedQ  DD: 07/16/2008  DT: 07/17/2008  Job #: [id number]   cc:   Erasmo Downer, MD

## 2010-09-23 NOTE — Assessment & Plan Note (Signed)
[facility]                          [facility] OFFICE NOTE   RAMON, BRANT                     MRN:          [id number]  DATE:09/11/2008                            DOB:          1950-09-17    PRIMARY CARE PHYSICIAN:  Erasmo Downer, MD   GASTROENTEROLOGIST:  Lionel December, MD   REASON FOR VISIT:  Post hospital followup.   HISTORY OF PRESENT ILLNESS:  Ms. Schwenn was seen during an inpatient  admission recently with a new diagnosis of atrial flutter associated  with rapid ventricular response.  She had previously documented atrial  fibrillation with a CHADS-2 score of 3 although had to come off Coumadin  earlier in the year due to gastrointestinal hemorrhage and progressive  anemia.  During her hospital stay, Coreg was adjusted and she was  ultimately placed on Cardizem CD with her final doses being Coreg 6.25  mg p.o. b.i.d. and Cardizem CD 180 mg p.o. daily to achieve a reasonable  heart rate control without bradycardia.  She remained off of Coumadin as  well as aspirin and is undergoing evaluation with capsule endoscopy  under the direction of Dr. Karilyn Cota at this point for further assessment  of her anemia.  The possibility of atrial flutter ablation was discussed  although deferred pending further assessment of her anemia because she  would require at least 4-6 weeks of Coumadin following an ablation  attempt.  She has had no recent angina and seems to be tolerating her  present medications.  She does bring in a home blood pressure and heart  rate record which does show her heart rate increasing at times into the  120s.  Systolic blood pressures have been in the 120s-140s.   ALLERGIES:  PENICILLIN.   PRESENT MEDICATIONS:  1. Lasix 40 mg p.o. b.i.d.  2. Metformin 500 mg p.o. b.i.d.  3. Levothyroxine 50 mcg p.o. daily.  4. Enalapril 30 mg p.o. daily.  5. Singulair 4 mg p.o. daily.  6. Poly-iron 150 mg p.o. b.i.d.  7. Folic  acid daily.  8. Mirapex ER daily.  9. Omeprazole 20 mg p.o. daily.  10.Cardizem CD 180 mg p.o. daily.  11.Carvedilol 6.25 mg.  12.Lantus 30 units subcu b.i.d.  13.Lipitor 20 mg p.o. daily.  14.Ambien 10 mg p.o. nightly.  15.Fluoxetine 20 mg p.o. daily.  16.Advair 250/50 one puff b.i.d.   REVIEW OF SYSTEMS:  As outlined above.  Otherwise negative.   PHYSICAL EXAMINATION:  VITAL SIGNS:  Blood pressure today 119/73, heart  rate is 60 and irregular, weight is 214 pounds.  GENERAL:  This is an obese chronic ill-appearing woman in no acute  distress.  HEENT:  Conjunctiva is normal.  Pharynx clear.  Poor dentition.  NECK:  Supple.  No elevated jugular venous pressure.  No loud bruits.  No thyromegaly is noted.  LUNGS:  Clear without labored breathing at rest.  Diminished breath  sounds are noted throughout.  CARDIAC:  Irregularly irregular rhythm.  No S3 gallop.  ABDOMEN:  Soft, nontender, protuberant.  EXTREMITIES:  Trace edema and venous stasis.  Distal pulses are 1+.  SKIN:  Warm and dry.  MUSCULOSKELETAL:  No kyphosis noted.  NEUROPSYCHIATRIC:  The patient is alert and oriented x3.  Affect is  appropriate.   IMPRESSION AND RECOMMENDATIONS:  1. Atrial flutter, duration not entirely clear, initially associated      with rapid ventricular response, although heart rate has come down      reasonably well.  I would like to advance Cardizem CD to 240 mg a      day and continue the present dose of Coreg for better heart rate      control.  Decision regarding the potential for radiofrequency      ablation is being deferred pending her ongoing anemia evaluation as      she would require Coumadin in the setting of an ablation.  2. Previously diagnosed paroxysmal atrial fibrillation, largely in the      postoperative setting after bypass surgery, with a CHADS-2 of 3.      She is off Coumadin at this point.  3. History of gastrointestinal hemorrhage and progressive anemia on      Coumadin.   The patient is undergoing evaluation by Dr. Karilyn Cota with      capsule endoscopy at this time.  If we can at least sort out her      risk for recurrent hemorrhage on Coumadin, this would help      understand what her options are for catheter-based treatments of      her arrhythmia.  4. Coronary artery disease status post coronary bypass grafting with a      left ventricular ejection fraction of 45-50%.  She had a minor      troponin-I elevation due to supply and demand mismatch in the      setting of a rapid regular rate without obvious acute coronary      syndrome during her recent hospital stay.  5. History of sternal revision.     Jonelle Sidle, MD  Electronically Signed    SGM/MedQ  DD: 09/11/2008  DT: 09/12/2008  Job #: [id number]   cc:   Erasmo Downer, MD  Lionel December, M.D.

## 2010-09-23 NOTE — Assessment & Plan Note (Signed)
[facility]                          [facility] OFFICE NOTE   ALANIA, OVERHOLT                     MRN:          [id number]  DATE:10/11/2007                            DOB:          Apr 17, 1951    PRIMARY CARE PHYSICIAN:  Ernestine Conrad, M.D.   REASON FOR VISIT:  Cardiac follow-up.   HISTORY OF PRESENT ILLNESS:  I saw [name] back in April.  She was  followed by Dr. Donata Clay in the meanwhile and overall is recuperating  fairly well from her bypass surgery.  I did have her wear a CardioNet  monitor which clearly documents recurrent atrial fibrillation, possibly  even some limited flutter.  She is not particularly symptomatic with  this, although occasionally describes a sense of fluttering.  She has  what seems to be appropriate residual sternal discomfort following her  surgery.  She reports that she has been walking regularly.  Today we  talked about Coumadin given her CHADS2 score of 3.  She is off of Plavix  at this time.   ALLERGIES:  PENICILLIN.   MEDICATIONS:  1. Ambien 10 mg p.o. nightly.  2. Fluoxetine 20 mg p.o. daily.  3. Advair b.i.d.  4. Metformin 1000 mg p.o. b.i.d.  5. Amiodarone 200 mg p.o. daily.  6. Lipitor 20 mg p.o. daily.  7. Omeprazole 20 mg p.o. daily.  8. Glipizide 5 mg p.o. b.i.d.  9. Ambien 10 mg p.o. nightly.  10.Singulair 10 mg p.o. daily.  11.Enteric coated aspirin 81 mg p.o. daily.  12.Mirapex 0.25 mg p.o. daily.   REVIEW OF SYSTEMS:  As described in history of present illness.  No  bleeding problems.  No dizziness or syncope.   PHYSICAL EXAMINATION:  VITAL SIGNS:  Blood pressure is 153/87, heart  rate 64, weight 178.6 pounds.  GENERAL APPEARANCE:  The patient is comfortable and in no acute  distress.  HEENT:  Conjunctivae and lids normal.  Oropharynx is clear.  NECK:  Supple.  No elevated jugular venous pressure , no loud bruits, no  thyromegaly is noted.  LUNGS:  Clear without labored  breathing.  CARDIAC:  Regular rate and rhythm, no S3 gallop or loud murmur.  CHEST:  Chest wall shows well-healing sternal incision.  ABDOMEN:  Soft, nontender.  EXTREMITIES:  Stable-appearing vein harvest sites, trace edema, distal  pulses are 1 to 2+.  SKIN:  Warm and dry.  MUSCULOSKELETAL:  No kyphosis noted.  NEUROPSYCHIATRIC:  The patient is alert and oriented x3.  Affect is  appropriate.   IMPRESSION/RECOMMENDATIONS:  1. Left main/multivessel cardiovascular disease, status post coronary      artery bypass grafting with an ejection fraction of approximately      40%.  Her regimen is outlined above.  I would like to add Altace      2.5 mg to her regimen today, we can titrate this going forward.      She has had a previous problem with bradycardia on beta blocker      therapy and we will hold off for the time being.  I have encouraged  her to continue regular walking and we will plan to see her back      over the next few months.  2. Paroxysmal atrial fibrillation, with a CHADS2 score of 3.  We will      continue amiodarone at this point and initiate Coumadin with follow-      up through our Coumadin clinic for stroke prophylaxis.  She will      also stay on her low-dose aspirin.  She is no longer on Plavix.  3. Hyperlipidemia, on Lipitor.  Goal LDL should be around 70.     Jonelle Sidle, MD  Electronically Signed    SGM/MedQ  DD: 10/11/2007  DT: 10/11/2007  Job #: [id number]

## 2010-09-23 NOTE — Assessment & Plan Note (Signed)
OFFICE VISIT   HAJRA, PORT  DOB:  [birth year]                                        August 16, 2008  CHART #:  [id number]   The patient comes to the office today because of feeling knots in her  sternum.  In June 27, 2008, she underwent sternal rewiring.  She  underwent exploration of her mediastinal incision.  The sternal bone was  found to be in good apposition and wires intact and the incision was  closed with Vicryl sutures.  The patient notes now some knots in her  sternal incision and comes to the office.  She denies any fever or  chills.  She has had some redness at the upper portion of incision.   PHYSICAL EXAMINATION:  VITAL SIGNS:  Her blood pressure 153/60, pulse is  53, respiratory rate 18, and O2 sats 98%.  CHEST:  On examination of the sternal incision, the bone feels intact.  She does have some nodular densities in the incision, but the incision  itself is intact and without evidence of obvious infection.  There is no  drainage or breakdown on incision.  There is no sternal instability.   I suspect she has some inflammatory response as the Vicryl sutures are  dissolving and this is primary symptom.  We will obtain a followup chest  x-ray and have her return to the office in a couple of weeks to follow  up.  Should she have any fever or chills, she will let us know  immediately.   Sheliah Plane, MD  Electronically Signed   EG/MEDQ  D:  08/16/2008  T:  08/17/2008  Job:  [id number]   cc:   Kerin Perna, M.D.

## 2010-09-23 NOTE — Assessment & Plan Note (Signed)
OFFICE VISIT   [name], [name]  DOB:  07/27/1950                                        August 31, 2008  CHART #:  [id number]   The patient has returned today for followup of her sternotomy incision,  status post coronary artery bypass graft surgery and subsequent revision  of her sternotomy incision due to possible sternal defect and chronic  pain.  Dr. Donata Clay re-explored her sternotomy incision on June 27, 2008, and noted that all of her sternal wires were intact.  It was felt  that the pectoralis fascia was not reapproximated and leaving a defect.  The wound was closed.  She was last seen on August 16, 2008, by Dr.  Tyrone Sage, at which time the incision was felt to be doing fairly well  except for a few small knots that were palpable in the depth of the  incision.  She returns today for re-examination of the incision.  She  said she still has some mild discomfort.  She has still noticed some  palpable knots beneath the incision.  She has also noticed some rash  along the upper portion of the incision that is itching.  She has been  using Benadryl for that.  She has not been putting anything on the  incision.  She also notes that she has been having tachyarrhythmias with  a heart rate that has been in the 120-130s most of time over the past 3  weeks.  She said occasionally it will slow down less than 100, but most  of the time it has been 120-130s.  She said she talked with Dr.  Ival Bible office in [place] and was scheduled for an appointment on Sep 12, 2008.  She notes that when her heart rate is in the 130, she has some  shortness of breath and difficulty lying down.  She has also had  exertional dyspnea.  She said she had some problems with postoperative  atrial fibrillation and was on amiodarone and Coumadin, but the Coumadin  had to be discontinued due to some GI bleeding.   PHYSICAL EXAMINATION:  VITAL SIGNS:  Her blood pressure is 156/101, her  pulse is 127 and irregular.  Respiratory rate is 18 and unlabored.  Oxygen saturation on room air is 100%.  GENERAL:  She looks well.  CARDIAC:  Irregular rate and rhythm.  There is no murmur.  LUNGS:  Clear.  CHEST:  The chest incision is well healed.  There are a few palpable  knots beneath the incision, but no signs of infection.  There is a  slight rash over the skin with a few small papules present.   Her medication list was reviewed and is in the chart.   IMPRESSION:  The patient's incision appears to be healing well.  There  is a slight rash over the skin and I recommend that she try some  hydrocortisone cream since this is itching.  I do not see any problem  with the healing process and there is no sign of infection.  He does  appear to have tachyarrhythmias and maybe in atrial fibrillation with a  rapid ventricular response.  I could not contact Dr. Diona Browner at the  [facility] today, but his nurse said that he is on-call at [facility].  I advised her  to go to [facility] Emergency Room now, so that she  can have an electrocardiogram and further evaluation of her  tachyarrhythmia which is causing some symptoms.   Evelene Croon, M.D.  Electronically Signed   BB/MEDQ  D:  08/31/2008  T:  09/01/2008  Job:  [id number]

## 2010-09-23 NOTE — H&P (Signed)
NAMEEVELISE, REINE NO.:  [id number]   MEDICAL RECORD NO.:  [id number]          PATIENT TYPE:  INP   LOCATION:  2921                         FACILITY:  [facility]   PHYSICIAN:  Vernice Jefferson, MD          DATE OF BIRTH:  06/23/1950   DATE OF ADMISSION:  08/12/2007  DATE OF DISCHARGE:                              HISTORY & PHYSICAL   The patient is being admitted to [facility]   CHIEF COMPLAINT:  Shortness of breath and chest pressure.   HISTORY OF PRESENT ILLNESS:  The patient is a 60 year old white female  with a history of diabetes, hypertension, hyperlipidemia, and COPD who  presented to [facility] after complaining of increasing shortness of  breath.  It began approximately 7 p.m. last night, also associated with  chest pressure.  She reports that all these symptoms occurred together.  She does give a history of increasing chest pressure episodes which she  has billed as indigestion.  Her primary care doctor has given her  increasing amounts of PPI which she has not taken yet, but she reports  that these symptoms have gone on for several weeks.  She reports some  occasional association with exertion that is relieved by rest.  While at [facility], the patient was by chest x-ray and laboratory data  found to be in heart failure.  She was given Lasix, placed on a nitro  drip, and was sent to [facility] for further evaluation of new  onset heart failure.  Additionally, she was found to have positive  cardiac biomarkers there.   PAST MEDICAL HISTORY:  1. Diabetes, oral controlled.  2. Hypertension.  3. Hyperlipidemia.  4. COPD.   MEDICATIONS:  1. Altace 10 mg a day.  2. Ambien 10 mg at bedtime.  3. Fluoxetine 20 mg a day.  4. Arthrotek 1 tab b.i.d.  5. Metformin 1 gram b.i.d.  6. Mirapex 0.25 mg b.i.d.  7. Lovastatin 40 mg a day.  8. Singulair 10 mg a day.  9. Nabumetone 1 gram a day.  10.Omeprazole 20 mg a day.  11.Glipizide 5 mg a day.   ALLERGIES:  PENICILLIN.   SOCIAL HISTORY:  Current smoker, nondrinker, non-IV-drug-user.   FAMILY HISTORY:  Reviewed and noncontributory to the patient's current  medical condition.   REVIEW OF SYSTEMS:  Negative 11-point review of systems except as  dictated above in the HPI.   PHYSICAL EXAMINATION:  VITAL SIGNS:  Blood pressure is 111/52, heart  rate 69, temperature is afebrile.  GENERAL:  Well-developed, well-nourished, obese, white female in no  acute distress.  HEENT:  Moist mucous membranes.  Sclerae clear and no conjunctival  pallor.  NECK:  Supple.  Full range of motion.  Jugular venous pressure estimated  12-cmH2O.  No carotid bruits noted.  CARDIAC:  Regular rate and rhythm.  No rubs, murmurs, or gallops.  CHEST:  Decreased breath sounds at the right base with rales which is  greater than the left side.  ABDOMEN:  Soft, nontender, nondistended.  Normoactive bowel sounds.  EXTREMITIES:  No peripheral edema.  Pulses 2  plus bilaterally.   LABORATORY DATA:  Significant for a CK of 112, MB of 5.3, troponin of  0.48. Creatinine 1.2.  Coagulation studies within normal limits.  Hemoglobin 12.1.  BNP of 1591.  EKG demonstrates a normal sinus rhythm  with 1-mm ST segment depression in the lateral leads.   IMPRESSION:  1. Non-ST-elevation myocardial infarction in the setting of possible      volume overload.  2. Volume overload secondary to systolic versus diastolic heart      failure.  3. Chronic obstructive pulmonary disease.  4. Diabetes.  5. Hyperlipidemia.  6. Hypertension.   PLAN:  Admit the patient to the stepdown unit.  She has already received  some Lasix with some urine output.  May need to repeat it but want to  repeat the EKG, portable chest x-ray, and we will get a 2D  echocardiogram, follow biomarkers serially to find out how high risk at  least right now her MB fraction is not positive and troponin in the face  of her mildly decreased GFR may be renal  insufficiency, however, due to  the EKG changes I may want to pursue further aggressive risk  stratification.  At least for now I will manage with beta-blocker,  Statin, aspirin, keep her NPO for further evaluation today.      Vernice Jefferson, MD  Electronically Signed     JT/MEDQ  D:  08/12/2007  T:  08/12/2007  Job:  [id number]

## 2010-09-23 NOTE — Assessment & Plan Note (Signed)
[facility]                          [facility] OFFICE NOTE   [name], [name]                     MRN:          [id number]  DATE:12/14/2008                            DOB:          Oct 10, 1950    PRIMARY CARDIOLOGIST:  Jonelle Sidle, MD   REASON FOR VISIT:  Scheduled followup.   HISTORY OF PRESENT ILLNESS:  Since last seen here in the clinic here in  June, [name] was rehospitalized here at [facility], secondary to  worsening lower extremity edema and shortness of breath.  She was  treated by Dr. Linna Darner, with adjustments in her medication regimen notable  for substitution of furosemide with Bumex.  We were not called for  formal consultation.  She was discharged on hospital day #3. Chest x-ray  was suggestive of mild interstitial pulmonary edema.   Since then, [name] notes marked improvement in her lower  extremity edema.  She has seen Dr. Linna Darner in followup, and is due for  blood work next week.  She has diuresed 8 pounds since her last office  visit.  She is compliant with her medications, and is not using any  table salt or imbibing excessive amounts of water.   [name] has also since been placed on supplemental oxygen at  night.  She denies any PND, but continues to sleep with her head  elevated.   With respect to her history of atrial fibrillation, she denies any tachy-  palpitations.  When last seen, we referred her for a 48-hour Holter  monitor.  This yielded no definite evidence of atrial fibrillation or  flutter.  There was question of isorhythmic dissociation versus  idiojunctional rhythm, at approximately 50 bpm.  Her heart rate ranged  from 47-79, with an average of 57, and there were no significant pauses.  Her EKG today shows probable isorhythmic dissociation with slow  ventricular response.   CURRENT MEDICATIONS:  1. Diltiazem ER 180 daily.  2. Carvedilol 6.25 b.i.d.  3. Bumetanide 1 mg b.i.d.  4. Poly-iron 150 daily.  5. Ferrous sulfate 325 daily.  6. Fluoxetine 20 daily.  7. Lipitor 20 daily.  8. Amiodarone 200 mg daily.  9. Spironolactone 25 mg b.i.d.  10.Metformin 500 mg b.i.d.  11.Levothyroxine 0.05 mg daily.  12.Singulair 10 daily.  13.Carbidopa/levodopa 1 tablet b.i.d.  14.Lantus 30 units b.i.d.  15.Novolin 70/30 units b.i.d.  16.Advair 1 puff b.i.d.   REVIEW OF SYSTEMS:  The patient has history of recurrent GI bleeding,  and denies any recent evidence of a hematochezia or melena.  Otherwise  as noted per HPI, all other systems reviewed and are negative.   A recent right lower extremity venous Doppler was negative for DVT,  following complaint of right lower extremity swelling.   PHYSICAL EXAMINATION:  VITAL SIGNS:  Blood pressure 119/67, pulse 45,  regular, weight 224 (down 8).  GENERAL:  [name], obese, sitting upright, in no distress.  HEENT:  Normocephalic, atraumatic.  NECK:  Palpable bilateral carotid pulse without bruits; no JVD at 90  degrees.  LUNGS:  Clear to auscultation throughout.  HEART:  Regular rate and rhythm.  No significant murmurs.  ABDOMEN:  Protuberant, intact bowel sounds.  EXTREMITIES:  Trace pedal edema.  NEURO:  Flat affect, but no focal deficit.   IMPRESSION:  1. Status post acute/chronic systolic heart failure.      a.     EF 50% by 2-D echo, January 2010.  2. Multivessel coronary artery disease, quiescent.      a.     Four-vessel CABG, April 2009.      b.     Status post fibrous malunion of sternotomy.  3. Atrial dysrhythmia.      a.     History of atrial flutter (type 1).      b.     No definite atrial fibrillation/flutter by recent Holter       monitoring on Amiodarone      c.     Question intermittent idiojunctional rhythm.      d.     Current evidence of probable isorhythmic dissociation.  4. History of recurrent GIB - now off Coumadin and aspirin.  5. Chronic anemia.  6. IDDM.  7. Hypertension.  8.  Dyslipidemia.  9. Mild, chronic renal insufficiency.   PLAN:  1. With respect to the issue of Coumadin, we will continue to hold off      on this, in the absence of any definite evidence of atrial      fibrillation or flutter.  Moreover, she has history of recurrent GI      bleed while on therapy.  2. Given the patient's demonstration of persistent dysrhythmia, with      probable isorhythmic dissociation by current EKG, we will continue      with the reduced levels of both amiodarone and diltiazem, but will      discontinue carvedilol, altogether.  This is to see if she can      resume a normal sinus rhythm or, at least, demonstrate a higher      basal heart rate off the beta-blocker.  If, however, she continues      to demonstrate persistent bradycardia, then we will strongly      consider referring her to our EP team in [facility] for further      recommendations, perhaps also involving consideration for permanent      pacemaker implantation.  We have also raised the possibility of      radiofrequency ablation, but have noted that she remains at      increased risk for recurrent bleed.  3. Continue current diuretic regimen, which has proved quite      successful, with early followup with Dr. Linna Darner.  Of note, the      patient is scheduled to have blood work next week.  4. Schedule early clinic followup with myself and Dr. Diona Browner in 1      month.      Diana Searing, PA-C  Electronically Signed      Jonelle Sidle, MD  Electronically Signed   GS/MedQ  DD: 12/14/2008  DT: 12/15/2008  Job #: [id number]   cc:   Erasmo Downer, MD

## 2010-09-23 NOTE — Assessment & Plan Note (Signed)
[facility]                          [facility] OFFICE NOTE   QUINTERIA, CHISUM                     MRN:          [id number]  DATE:02/13/2008                            DOB:          1950-06-01    PRIMARY CARE PHYSICIAN:  Dr. Ernestine Conrad.   REASON FOR VISIT:  Scheduled followup.   HISTORY OF PRESENT ILLNESS:  [name] returns in the office today  with her daughter.  She reports dyspnea on exertion and NYHA class II-  III, but no anginal chest pain.  Her medicines look very different  compared to my previous note from June.  She is now no longer on either  Lipitor or amiodarone, and she has been switched from Altace to  lisinopril and hydrochlorothiazide with a subsequent addition of low-  dose atenolol.  Today's electrocardiogram shows sinus bradycardia at 55  beats per minute with decreased R waves and inferior Q's.  The patient  and her daughter have both noted that the sternal area exhibits a gap  and is not associated with any major movement, but she does have mild  discomfort in this area.  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  She is not reporting any significant palpitations at this  time and is tolerating Coumadin without any obvious bleeding problems.  She had an INR obtained recently on February 02, 2008, that was within  therapeutic range at 2.9.   ALLERGIES:  PENICILLIN.   PRESENT MEDICATIONS:  1. Atenolol 12.5 mg p.o. daily.  2. Lisinopril and hydrochlorothiazide 20/12.5 mg one-half tablet p.o.      daily.  3. Coumadin as directed by [facility].  4. Mirapex 0.25 mg p.o. daily.  5. Enteric-coated aspirin 162 mg p.o. daily.  6. Singulair 10 mg p.o. daily.  7. Glipizide 5 mg p.o. b.i.d.  8. Omeprazole 20 mg p.o. daily.  9. Metformin 1000 mg p.o. b.i.d.  10.Ambien 10 mg p.o. daily.  11.Fluoxetine 20 mg p.o. daily.   REVIEW OF SYSTEMS:  As per the history of present illness.  Otherwise,  negative.   PHYSICAL EXAMINATION:  VITAL SIGNS:  Blood pressure today is 167/87;  heart rate is 55 and regular; weight is 190.8 pounds, which is up from  178 at her last visit.  GENERAL:  An overweight woman and chronically ill-appearing in no acute  distress.  HEENT:  Conjunctivae are normal.  Oropharynx clear with poor dentition.  NECK:  Supple.  Mildly increased jugular venous pressure and no bruits.  LUNGS:  Exhibit clear breath sounds diminished at the bases.  No active  wheezing or labored breathing.  CARDIAC:  A regular rate and rhythm.  No pericardial rub or S3 gallop.  ABDOMEN:  Soft.  Bowel sounds are present.  EXTREMITIES:  Exhibit trace edema around the right ankle.  Nonpitting  distal pulses 1+.  SKIN:  Warm and dry.  MUSCULOSKELETAL:  No kyphosis noted.  NEUROPSYCHIATRIC:  The patient is alert and oriented x3.  Affect is  somewhat flat.   IMPRESSION AND RECOMMENDATIONS:  1. Left main/multivessel cardiovascular disease, status post coronary  artery bypass grafting earlier in the year with an ejection      fraction of 40%.  She has New York Heart Association class II to      III dyspnea on exertion, but no chest pain observed in no angina.      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 As far as her medical regimen, I have asked her to      increase lisinopril and hydrochlorothiazide to 1 full pill daily.      We will plan a followup BMET and BNP in 1 week's time.  Her weight      is up substantially, although this is likely a combination of      weight gain, possibly some element of fluid gain as well, although      she is not exhibiting any major lower extremity edema.  She is in      sinus bradycardia today and therefore we will hold off      reinstituting amiodarone, but would not discontinue Coumadin given      her clearly documented atrial fibrillation and  flutter      postoperatively.  A noncontrasted CT scan of the chest will be      obtained to assess for sternal nonunion, and I will have her follow      up in the office to discuss these results, perhaps referring her      back to Dr. Morton Peters if needed  2. Hyperlipidemia, presently not on statin therapy.  We will obtain a      followup lipid profile and liver function test.  I anticipate, she      will need to be back on the statin therapy.  It is not clear to me      when her Lipitor was discontinued.     Jonelle Sidle, MD  Electronically Signed    SGM/MedQ  DD: 02/13/2008  DT: 02/14/2008  Job #: [id number]   cc:   Ernestine Conrad

## 2010-09-24 ENCOUNTER — Telehealth: Payer: Self-pay | Admitting: *Deleted

## 2010-09-24 NOTE — Telephone Encounter (Signed)
Pt states she was in the hospital 5/7-5/11 d/t pneumonia. She states she is still taking antibiotics but starting to feel better. She states her heart is doing well.   ----- Message ----- From: Jonelle Sidle, MD Sent: 09/17/2010 9:14 AM To: Garnet Sierras, LPN Subject: FW: patient   Please check with patient to see how she is doing in terms of dyspnea, weight, medical therapy. If worsening symptoms, then can make sure she has a followup visit, otherwise continue same.  ----- Message ----- From: Gardiner Rhyme, MD Sent: 09/16/2010 6:50 PM To: Jonelle Sidle, MD Subject: patient   Ms Venuti' optivol is elevated by her recent ICD remote check. She may benefit from a phone call or office visit with you.

## 2010-09-26 NOTE — Assessment & Plan Note (Signed)
[facility]                          [facility] OFFICE NOTE   SAMEERA, BETTON             MRN:          [id number]  DATE:09/25/2008                            DOB:          1950-07-09    Ms. Verne has been at [facility] with acute CHF and rapid  atrial flutter.  After very careful analysis by Dr. Andee Lineman and myself  and while working with Dr. Linna Darner, the following issues have been worked  on.   1. Positive stool.  Her hemoglobin is 10.1.  Dr. Linna Darner continues to      try to determine if there is obvious source of bleeding.  She had a      capsule study which unfortunately was nonconclusive because there      was some problem with I think stool remaining in the small bowel.      A nuclear medicine study was done during this hospitalization and      showed no obvious bleeding.  The issue of how to deal with      potential bleeding is still unresolved.  2. Atrial flutter.  The best option will be ablation.  However, we      cannot coumadinize her yet.  We considered doing a TEE      cardioversion, but with the positive stool decided not to do that.      Ultimately, she will have early follow-up with Dr. Diona Browner.  We      made a decision to add amiodarone.  During this hospitalization      starting on Sep 21, 2008, she was put on amiodarone 200 b.i.d.      This did help her rate control.  3. History of coronary disease, stable.  4. History of GI bleeding.  See above.  5. History of ejection fraction 40-45%.  6. History of RV dysfunction.  7. Acute systolic heart failure.  The patient on diuretics diuresed      nicely here.  This may have been playing a role in her atrial      flutter rate also.  She is to go home on diuretics.   PLAN:  The plan at this point will be to:   1. Continue amiodarone at 400 a day.  When she sees Dr. Diona Browner, he      can adjust the dose.  2. Dr. Linna Darner will continue with the thought process  concerning her GI      blood loss.  3. Continue Lasix as an outpatient.  4. Continue her on her current dose of Cardizem and beta blocker.   At the time of follow-up with Dr. Diona Browner, I hope that consideration  can be given to the possibility of arranging for atrial flutter ablation  realizing that Coumadin needs to be used.  With that scenario the  possibility of using limited Coumadin for approximately 30 days with  very careful follow-up of her hemoglobin during this period.  This may  be our only choice for safely providing atrial flutter ablation which  she needs in the setting of a patient who has intermittent GI blood loss  from a source  that we cannot find.    Luis Abed, MD, [facility]  Electronically Signed   JDK/MedQ  DD: 09/25/2008  DT: 09/25/2008  Job #: [id number]   cc:   Jonelle Sidle, MD

## 2010-10-03 ENCOUNTER — Other Ambulatory Visit: Payer: Self-pay | Admitting: Cardiology

## 2010-10-07 NOTE — Telephone Encounter (Signed)
[place] pt. 

## 2010-12-04 ENCOUNTER — Encounter: Payer: Medicare Other | Admitting: *Deleted

## 2010-12-09 ENCOUNTER — Encounter: Payer: Self-pay | Admitting: *Deleted

## 2010-12-31 ENCOUNTER — Encounter: Payer: Self-pay | Admitting: *Deleted

## 2011-01-05 ENCOUNTER — Ambulatory Visit (INDEPENDENT_AMBULATORY_CARE_PROVIDER_SITE_OTHER): Payer: Medicare Other | Admitting: Cardiology

## 2011-01-05 ENCOUNTER — Encounter: Payer: Self-pay | Admitting: Cardiology

## 2011-01-05 VITALS — BP 116/76 | HR 61 | Ht 62.0 in | Wt 160.0 lb

## 2011-01-05 DIAGNOSIS — I10 Essential (primary) hypertension: Secondary | ICD-10-CM

## 2011-01-05 DIAGNOSIS — Z720 Tobacco use: Secondary | ICD-10-CM

## 2011-01-05 DIAGNOSIS — E782 Mixed hyperlipidemia: Secondary | ICD-10-CM

## 2011-01-05 DIAGNOSIS — I2510 Atherosclerotic heart disease of native coronary artery without angina pectoris: Secondary | ICD-10-CM

## 2011-01-05 DIAGNOSIS — I4892 Unspecified atrial flutter: Secondary | ICD-10-CM

## 2011-01-05 DIAGNOSIS — Z79899 Other long term (current) drug therapy: Secondary | ICD-10-CM

## 2011-01-05 DIAGNOSIS — F17200 Nicotine dependence, unspecified, uncomplicated: Secondary | ICD-10-CM

## 2011-01-05 DIAGNOSIS — I495 Sick sinus syndrome: Secondary | ICD-10-CM

## 2011-01-05 NOTE — Patient Instructions (Signed)
Your physician wants you to follow-up in: 6 months. You will receive a reminder letter in the mail one-two months in advance. If you don't receive a letter, please call our office to schedule the follow-up appointment. Your physician recommends that you continue on your current medications as directed. Please refer to the Current Medication list given to you today. Your physician recommends that you go to the [facility] for lab work: FLP/LFT If the results of your test are normal or stable, you will receive a letter. If they are abnormal, the nurse will contact you by phone.

## 2011-01-05 NOTE — Assessment & Plan Note (Signed)
Blood pressure well-controlled today. 

## 2011-01-05 NOTE — Assessment & Plan Note (Signed)
She is due for a followup fasting lipid profile and liver function tests, to be arranged.

## 2011-01-05 NOTE — Assessment & Plan Note (Signed)
Symptomatically stable on present medical therapy, overall fairly limited regimen. She is not on antiplatelet medications related to significant GI bleeding in the past. We discussed diet and exercise, continued observation for now.

## 2011-01-05 NOTE — Progress Notes (Signed)
Clinical Summary [name] is a 60 y.o.female presenting for followup. She was seen back in February.  She reports feeling generally well, no angina or progressive shortness of breath. States that she has been working in her garden this summer. She has been able to lose almost 20 pounds through diet. She reports compliance with the medications review below.  She does admit that she has not been following her blood glucose very carefully. I asked her to continue followup with Dr. Olena Howard for management of her diabetes.  She has also been smoking cigarettes, approximately 1/2 pack per day. She states that stress has been a contributor. We discussed smoking cessation strategies. States that she did not achieve much benefit from Chantix or nicotine patches in the past.  She is due for followup lab work regarding lipid status.   Allergies  Allergen Reactions  . Hydrocodone   . Latex   . Nsaids   . Other     ESGESIC  . Penicillins   . Tape     Medication list reviewed.  Past Medical History  Diagnosis Date  . Atrial fibrillation   . Coronary artery disease     Multivessel  . Cardiomyopathy, ischemic     EF 30-35%  . Chronic systolic heart failure     NYHA Class II/III CHF  . Tachycardia-bradycardia syndrome   . Chronic anemia     h/o recurrent GIB  . Type 2 diabetes mellitus   . COPD (chronic obstructive pulmonary disease)   . DJD (degenerative joint disease)   . RLS (restless legs syndrome)   . Atrial flutter     s/p RFA 11/10  . Essential hypertension, benign   . Hyperlipidemia   . Noncompliance     Past Surgical History  Procedure Date  . Carpal tunnel release   . Total abdominal hysterectomy   . 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 Sternal incision reclosure 06/2008    Revision  . Cardiac defibrillator placement 03/2009    [employer]tronic    Family History  Problem  Relation Age of Onset  . Coronary artery disease      Social History [name] reports that she has been smoking Cigarettes.  She has a 20 pack-year smoking history. She has never used smokeless tobacco. [name] reports that she does not drink alcohol.  Review of Systems No palpitations or syncope. No reported bleeding problems. Otherwise negative.  Physical Examination Filed Vitals:   01/05/11 0858  BP: 116/76  Pulse: 61   Overweight woman, in no acute distress. Has lost weight since last visit.  HEENT: Conjunctiva and lids normal, oropharynx with poor dentition.  Neck: Supple, increased girth, no bruits or obvious elevation in JVP.  Lungs: Diminished breath sounds, nonlabored.  Cardiac: Regular rate and rhythm, indistinct PMI, no S3.  Abdomen: Obese, soft, bowel sounds present.  Extremities: No pitting edema, distal pulses one plus.  Skin: Warm and dry. Rash on abdomen.  Musculoskeletal: No kyphosis.  Neuropsychiatric: Alert and oriented x3, affect appropriate.   ECG Reviewed in EMR.   Problem List and Plan

## 2011-01-05 NOTE — Assessment & Plan Note (Signed)
Status post [employer]tr[employer] ICD, no device discharges. She is due for a transtelephonic followup, reports that her phone service has been out over the last month. She will have it checked in September. Continue followup with Dr. Johney Frame.

## 2011-01-05 NOTE — Assessment & Plan Note (Signed)
Status post ablation in 2010, quiescent. Not on Coumadin with history of significant GI bleeding in the past.

## 2011-01-05 NOTE — Assessment & Plan Note (Signed)
We discussed smoking cessation strategies today. 

## 2011-02-03 LAB — COMPREHENSIVE METABOLIC PANEL
Albumin: 3.4 — ABNORMAL LOW
Alkaline Phosphatase: 84
BUN: 14
CO2: 21
Chloride: 103
GFR calc non Af Amer: 37 — ABNORMAL LOW
Potassium: 3.7
Total Bilirubin: 0.6

## 2011-02-03 LAB — POCT I-STAT 3, VENOUS BLOOD GAS (G3P V)
Acid-Base Excess: 1
Operator id: 211741
TCO2: 34
pCO2, Ven: 55.7 — ABNORMAL HIGH
pH, Ven: 7.374 — ABNORMAL HIGH
pO2, Ven: 69 — ABNORMAL HIGH

## 2011-02-03 LAB — POCT I-STAT 4, (NA,K, GLUC, HGB,HCT)
Glucose, Bld: 132 — ABNORMAL HIGH
Glucose, Bld: 141 — ABNORMAL HIGH
Glucose, Bld: 178 — ABNORMAL HIGH
HCT: 28 — ABNORMAL LOW
HCT: 33 — ABNORMAL LOW
Hemoglobin: 11.2 — ABNORMAL LOW
Hemoglobin: 7.8 — CL
Hemoglobin: 8.2 — ABNORMAL LOW
Hemoglobin: 9.5 — ABNORMAL LOW
Operator id: 288291
Operator id: 3342
Operator id: 3342
Operator id: 3342
Potassium: 4
Potassium: 4.3
Potassium: 5.9 — ABNORMAL HIGH
Sodium: 136
Sodium: 136

## 2011-02-03 LAB — FOLATE: Folate: 11.3

## 2011-02-03 LAB — POCT I-STAT GLUCOSE
Glucose, Bld: 86
Operator id: 3342

## 2011-02-03 LAB — PHOSPHORUS: Phosphorus: 4.5

## 2011-02-03 LAB — CROSSMATCH
ABO/RH(D): A NEG
Antibody Screen: NEGATIVE

## 2011-02-03 LAB — CBC
HCT: 27.6 — ABNORMAL LOW
HCT: 27.7 — ABNORMAL LOW
HCT: 28.3 — ABNORMAL LOW
HCT: 29.1 — ABNORMAL LOW
HCT: 29.1 — ABNORMAL LOW
HCT: 29.1 — ABNORMAL LOW
HCT: 29.9 — ABNORMAL LOW
HCT: 30.3 — ABNORMAL LOW
HCT: 31 — ABNORMAL LOW
HCT: 31.5 — ABNORMAL LOW
HCT: 32.6 — ABNORMAL LOW
HCT: 33.6 — ABNORMAL LOW
HCT: 29.5 — ABNORMAL LOW
Hemoglobin: 10.2 — ABNORMAL LOW
Hemoglobin: 10.2 — ABNORMAL LOW
Hemoglobin: 10.3 — ABNORMAL LOW
Hemoglobin: 10.7 — ABNORMAL LOW
Hemoglobin: 11 — ABNORMAL LOW
Hemoglobin: 11.2 — ABNORMAL LOW
Hemoglobin: 9.4 — ABNORMAL LOW
Hemoglobin: 9.5 — ABNORMAL LOW
Hemoglobin: 9.6 — ABNORMAL LOW
Hemoglobin: 9.7 — ABNORMAL LOW
Hemoglobin: 9.7 — ABNORMAL LOW
Hemoglobin: 9.9 — ABNORMAL LOW
Hemoglobin: 9.9 — ABNORMAL LOW
MCHC: 33.2
MCHC: 33.2
MCHC: 33.2
MCHC: 33.3
MCHC: 33.4
MCHC: 33.7
MCHC: 34
MCHC: 34.2
MCHC: 34.3
MCHC: 34.5
MCHC: 33.6
MCV: 85.9
MCV: 86
MCV: 86.1
MCV: 86.2
MCV: 86.3
MCV: 86.4
MCV: 86.7
MCV: 87.2
MCV: 87.8
MCV: 88.5
MCV: 87.8
Platelets: 158
Platelets: 159
Platelets: 173
Platelets: 178
Platelets: 190
Platelets: 199
Platelets: 211
Platelets: 248
Platelets: 292
Platelets: 328
Platelets: 392
Platelets: 447 — ABNORMAL HIGH
RBC: 3.13 — ABNORMAL LOW
RBC: 3.15 — ABNORMAL LOW
RBC: 3.28 — ABNORMAL LOW
RBC: 3.34 — ABNORMAL LOW
RBC: 3.38 — ABNORMAL LOW
RBC: 3.38 — ABNORMAL LOW
RBC: 3.48 — ABNORMAL LOW
RBC: 3.53 — ABNORMAL LOW
RBC: 3.6 — ABNORMAL LOW
RBC: 3.63 — ABNORMAL LOW
RBC: 3.65 — ABNORMAL LOW
RBC: 3.74 — ABNORMAL LOW
RBC: 3.85 — ABNORMAL LOW
RBC: 3.36 — ABNORMAL LOW
RDW: 13.6
RDW: 13.9
RDW: 14.1
RDW: 14.1
RDW: 14.2
RDW: 14.3
RDW: 14.4
RDW: 14.4
RDW: 14.4
RDW: 14.7
RDW: 14.8
RDW: 14.4
WBC: 10
WBC: 10.2
WBC: 10.4
WBC: 10.6 — ABNORMAL HIGH
WBC: 11.4 — ABNORMAL HIGH
WBC: 12.7 — ABNORMAL HIGH
WBC: 14 — ABNORMAL HIGH
WBC: 8.1
WBC: 9.2
WBC: 9.6
WBC: 9.8
WBC: 12.6 — ABNORMAL HIGH

## 2011-02-03 LAB — BASIC METABOLIC PANEL
BUN: 11
BUN: 11
BUN: 12
BUN: 12
BUN: 15
BUN: 15
BUN: 8
BUN: 8
CO2: 27
CO2: 27
CO2: 28
CO2: 28
CO2: 28
CO2: 29
CO2: 30
CO2: 30
CO2: 32
CO2: 31
Calcium: 8.6
Calcium: 8.8
Calcium: 8.9
Calcium: 8.9
Calcium: 9
Calcium: 9
Calcium: 9.1
Calcium: 9.1
Calcium: 9.1
Chloride: 102
Chloride: 93 — ABNORMAL LOW
Chloride: 94 — ABNORMAL LOW
Chloride: 95 — ABNORMAL LOW
Chloride: 95 — ABNORMAL LOW
Chloride: 95 — ABNORMAL LOW
Chloride: 97
Chloride: 97
Chloride: 97
Chloride: 95 — ABNORMAL LOW
Creatinine, Ser: 0.95
Creatinine, Ser: 0.96
Creatinine, Ser: 0.99
Creatinine, Ser: 1.07
Creatinine, Ser: 1.11
Creatinine, Ser: 1.14
Creatinine, Ser: 1.15
Creatinine, Ser: 1.16
Creatinine, Ser: 1.06
GFR calc Af Amer: 58 — ABNORMAL LOW
GFR calc Af Amer: 59 — ABNORMAL LOW
GFR calc Af Amer: 59 — ABNORMAL LOW
GFR calc Af Amer: >60
GFR calc Af Amer: >60
GFR calc Af Amer: >60
GFR calc Af Amer: >60
GFR calc Af Amer: >60
GFR calc Af Amer: >60
GFR calc Af Amer: >60
GFR calc Af Amer: >60
GFR calc non Af Amer: 48 — ABNORMAL LOW
GFR calc non Af Amer: 48 — ABNORMAL LOW
GFR calc non Af Amer: 49 — ABNORMAL LOW
GFR calc non Af Amer: 50 — ABNORMAL LOW
GFR calc non Af Amer: 51 — ABNORMAL LOW
GFR calc non Af Amer: 53 — ABNORMAL LOW
GFR calc non Af Amer: 60 — ABNORMAL LOW
GFR calc non Af Amer: >60
GFR calc non Af Amer: 53 — ABNORMAL LOW
Glucose, Bld: 106 — ABNORMAL HIGH
Glucose, Bld: 106 — ABNORMAL HIGH
Glucose, Bld: 109 — ABNORMAL HIGH
Glucose, Bld: 128 — ABNORMAL HIGH
Glucose, Bld: 132 — ABNORMAL HIGH
Glucose, Bld: 145 — ABNORMAL HIGH
Glucose, Bld: 173 — ABNORMAL HIGH
Glucose, Bld: 174 — ABNORMAL HIGH
Glucose, Bld: 71
Glucose, Bld: 101 — ABNORMAL HIGH
Potassium: 3.2 — ABNORMAL LOW
Potassium: 3.6
Potassium: 3.8
Potassium: 3.8
Potassium: 3.8
Potassium: 3.9
Potassium: 4
Potassium: 4.1
Potassium: 4.2
Potassium: 4.2
Potassium: 4.3
Sodium: 132 — ABNORMAL LOW
Sodium: 132 — ABNORMAL LOW
Sodium: 132 — ABNORMAL LOW
Sodium: 133 — ABNORMAL LOW
Sodium: 134 — ABNORMAL LOW
Sodium: 135
Sodium: 138
Sodium: 139
Sodium: 139
Sodium: 135

## 2011-02-03 LAB — PROTIME-INR
INR: 1
INR: 1.1
Prothrombin Time: 14.7

## 2011-02-03 LAB — POCT I-STAT 3, ART BLOOD GAS (G3+)
Acid-Base Excess: 3 — ABNORMAL HIGH
Acid-Base Excess: 3 — ABNORMAL HIGH
Acid-Base Excess: 4 — ABNORMAL HIGH
Acid-Base Excess: 4 — ABNORMAL HIGH
Acid-base deficit: 1
Bicarbonate: 25.7 — ABNORMAL HIGH
Bicarbonate: 30.8 — ABNORMAL HIGH
O2 Saturation: 94
O2 Saturation: 98
Operator id: 196291
Operator id: 203371
Operator id: 211741
Operator id: 3342
Patient temperature: 36.3
Patient temperature: 37.4
Patient temperature: 37.8
TCO2: 31
TCO2: 33
pCO2 arterial: 43.5
pH, Arterial: 7.297 — ABNORMAL LOW
pH, Arterial: 7.402 — ABNORMAL HIGH
pH, Arterial: 7.412 — ABNORMAL HIGH
pH, Arterial: 7.418 — ABNORMAL HIGH
pO2, Arterial: 357 — ABNORMAL HIGH
pO2, Arterial: 72 — ABNORMAL LOW
pO2, Arterial: 74 — ABNORMAL LOW

## 2011-02-03 LAB — URINALYSIS, ROUTINE W REFLEX MICROSCOPIC
Bilirubin Urine: NEGATIVE
Glucose, UA: NEGATIVE
Ketones, ur: NEGATIVE
Leukocytes, UA: NEGATIVE
Nitrite: NEGATIVE
Protein, ur: NEGATIVE
Specific Gravity, Urine: 1.003 — ABNORMAL LOW
Urobilinogen, UA: 0.2
pH: 7

## 2011-02-03 LAB — POCT I-STAT, CHEM 8
BUN: 16
Calcium, Ion: 1.22
Calcium, Ion: 1.24
Chloride: 96
Glucose, Bld: 136 — ABNORMAL HIGH
Glucose, Bld: 169 — ABNORMAL HIGH
HCT: 30 — ABNORMAL LOW
HCT: 31 — ABNORMAL LOW
Hemoglobin: 10.5 — ABNORMAL LOW
TCO2: 26
TCO2: 31

## 2011-02-03 LAB — HEPARIN LEVEL (UNFRACTIONATED)
Heparin Unfractionated: 0.37
Heparin Unfractionated: 0.47
Heparin Unfractionated: 0.81 — ABNORMAL HIGH

## 2011-02-03 LAB — MAGNESIUM
Magnesium: 1.3 — ABNORMAL LOW
Magnesium: 2
Magnesium: 2.2
Magnesium: 2.7 — ABNORMAL HIGH
Magnesium: 3.1 — ABNORMAL HIGH

## 2011-02-03 LAB — PREPARE PLATELET PHERESIS

## 2011-02-03 LAB — CREATININE, SERUM
Creatinine, Ser: 0.9
Creatinine, Ser: 1.03
GFR calc Af Amer: >60
GFR calc Af Amer: >60
GFR calc non Af Amer: 55 — ABNORMAL LOW
GFR calc non Af Amer: >60

## 2011-02-03 LAB — LIPID PANEL
HDL: 54
Total CHOL/HDL Ratio: 3.4

## 2011-02-03 LAB — BLOOD GAS, ARTERIAL
Patient temperature: 98
TCO2: 32.8
pH, Arterial: 7.503 — ABNORMAL HIGH

## 2011-02-03 LAB — HEMOGLOBIN AND HEMATOCRIT, BLOOD: HCT: 23.5 — ABNORMAL LOW

## 2011-02-03 LAB — PREPARE FRESH FROZEN PLASMA

## 2011-02-03 LAB — FERRITIN: Ferritin: 42 (ref 10–291)

## 2011-02-03 LAB — APTT: aPTT: 29

## 2011-02-03 LAB — DIFFERENTIAL
Basophils Absolute: 0
Basophils Relative: 0
Eosinophils Relative: 0
Monocytes Absolute: 0.2
Neutro Abs: 9.9 — ABNORMAL HIGH

## 2011-02-03 LAB — CARDIAC PANEL(CRET KIN+CKTOT+MB+TROPI)
CK, MB: 142 — ABNORMAL HIGH
Relative Index: 12 — ABNORMAL HIGH

## 2011-02-03 LAB — URINE MICROSCOPIC-ADD ON

## 2011-02-03 LAB — PLATELET COUNT: Platelets: 149 — ABNORMAL LOW

## 2011-02-03 LAB — IRON: Iron: 47

## 2011-06-04 ENCOUNTER — Encounter: Payer: Self-pay | Admitting: *Deleted

## 2011-06-18 ENCOUNTER — Encounter: Payer: Self-pay | Admitting: *Deleted

## 2011-06-26 ENCOUNTER — Telehealth: Payer: Self-pay | Admitting: Internal Medicine

## 2011-06-26 NOTE — Telephone Encounter (Signed)
06-26-11 h# d/c, lm cell#  For pt to set up defib with allred, was due January/mt

## 2011-09-03 ENCOUNTER — Encounter: Payer: Self-pay | Admitting: Internal Medicine

## 2011-09-03 ENCOUNTER — Ambulatory Visit (INDEPENDENT_AMBULATORY_CARE_PROVIDER_SITE_OTHER): Payer: Medicare Other | Admitting: Internal Medicine

## 2011-09-03 VITALS — BP 108/69 | HR 64 | Ht 62.0 in | Wt 183.0 lb

## 2011-09-03 DIAGNOSIS — I2589 Other forms of chronic ischemic heart disease: Secondary | ICD-10-CM

## 2011-09-03 DIAGNOSIS — Z720 Tobacco use: Secondary | ICD-10-CM

## 2011-09-03 DIAGNOSIS — I5022 Chronic systolic (congestive) heart failure: Secondary | ICD-10-CM

## 2011-09-03 DIAGNOSIS — F17200 Nicotine dependence, unspecified, uncomplicated: Secondary | ICD-10-CM

## 2011-09-03 DIAGNOSIS — I10 Essential (primary) hypertension: Secondary | ICD-10-CM

## 2011-09-03 DIAGNOSIS — I495 Sick sinus syndrome: Secondary | ICD-10-CM

## 2011-09-03 LAB — ICD DEVICE OBSERVATION
AL AMPLITUDE: 3 mv
ATRIAL PACING ICD: 95.53 pct
BAMS-0001: 170 {beats}/min
CHARGE TIME: 9.699 s
HV IMPEDENCE: 66 Ohm
RV LEAD AMPLITUDE: 7.8 mv
RV LEAD IMPEDENCE ICD: 437 Ohm
TOT-0001: 2
TOT-0002: 0
TZAT-0001ATACH: 1
TZAT-0001FASTVT: 1
TZAT-0001SLOWVT: 1
TZAT-0001SLOWVT: 2
TZAT-0002ATACH: NEGATIVE
TZAT-0002ATACH: NEGATIVE
TZAT-0002ATACH: NEGATIVE
TZAT-0002FASTVT: NEGATIVE
TZAT-0002SLOWVT: NEGATIVE
TZAT-0012FASTVT: 200 ms
TZAT-0012SLOWVT: 200 ms
TZAT-0018ATACH: NEGATIVE
TZAT-0018ATACH: NEGATIVE
TZAT-0018FASTVT: NEGATIVE
TZAT-0018SLOWVT: NEGATIVE
TZAT-0019ATACH: 6 V
TZAT-0019ATACH: 6 V
TZAT-0020ATACH: 1.5 ms
TZAT-0020ATACH: 1.5 ms
TZAT-0020SLOWVT: 1.5 ms
TZON-0003SLOWVT: 400 ms
TZON-0004SLOWVT: 16
TZST-0001ATACH: 5
TZST-0001FASTVT: 2
TZST-0001FASTVT: 4
TZST-0001FASTVT: 6
TZST-0001SLOWVT: 3
TZST-0001SLOWVT: 5
TZST-0001SLOWVT: 6
TZST-0002ATACH: NEGATIVE
TZST-0002ATACH: NEGATIVE
TZST-0002ATACH: NEGATIVE
TZST-0002FASTVT: NEGATIVE
TZST-0002FASTVT: NEGATIVE
TZST-0002FASTVT: NEGATIVE
TZST-0002SLOWVT: NEGATIVE
TZST-0002SLOWVT: NEGATIVE
TZST-0002SLOWVT: NEGATIVE

## 2011-09-03 NOTE — Assessment & Plan Note (Signed)
euvolemic optivol not presently elevated  2 gram sodium diet encouraged  Normal ICD function See [facility] report No changes today

## 2011-09-03 NOTE — Assessment & Plan Note (Signed)
Stable No change required today  

## 2011-09-03 NOTE — Assessment & Plan Note (Signed)
Cessation strongly advised She is not ready to quit

## 2011-09-03 NOTE — Progress Notes (Signed)
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 female who presents today for routine electrophysiology followup.  Since last being seen in our clinic, the patient reports doing very well.  She continues to struggle with her diabetes management.  She also continues to smoke.  She requires home O2 at times. Today, she denies symptoms of palpitations, chest pain, shortness of breath (above her baseline), dizziness, presyncope, syncope, or ICD shocks.  + stable edema. The patient is otherwise without complaint today.   Past Medical History  Diagnosis Date  . Paroxysmal atrial fibrillation   . Coronary artery disease     Multivessel  . Cardiomyopathy, ischemic     EF 30-35%  . Chronic systolic heart failure     NYHA Class II/III CHF  . Tachycardia-bradycardia syndrome   . Chronic anemia     h/o recurrent GIB  . Type 2 diabetes mellitus   . COPD (chronic obstructive pulmonary disease)   . DJD (degenerative joint disease)   . RLS (restless legs syndrome)   . Atrial flutter     s/p RFA 11/10  . Essential hypertension, benign   . Hyperlipidemia   . Noncompliance    Past Surgical History  Procedure Date  . Carpal tunnel release   . Total abdominal hysterectomy   . 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 Sternal incision reclosure 06/2008    Revision  . Cardiac defibrillator placement 03/29/2009    MDT [employer] II DR ICD implanted by Dr Johney Frame    Current Outpatient Prescriptions  Medication Sig Dispense Refill  . albuterol (PROVENTIL HFA;VENTOLIN HFA) 108 (90 BASE) MCG/ACT inhaler Inhale 2 puffs into the lungs every 6 (six) hours as needed.      . bumetanide (BUMEX) 2 MG tablet Take 2 mg by mouth daily.        . carvedilol (COREG) 12.5 MG tablet Take 12.5 mg by mouth 2 (two) times daily with a meal.        . Fluticasone-Salmeterol (ADVAIR DISKUS) 250-50  MCG/DOSE AEPB Inhale 1 puff into the lungs every 12 (twelve) hours.        . gabapentin (NEURONTIN) 600 MG tablet Take 600 mg by mouth 3 (three) times daily.      . hydrOXYzine (ATARAX/VISTARIL) 25 MG tablet Take 25 mg by mouth 3 (three) times daily as needed.      . insulin aspart protamine-insulin aspart (NOVOLOG 70/30) (70-30) 100 UNIT/ML injection Inject 15 Units into the skin 2 (two) times daily with a meal.      . ipratropium (ATROVENT) 0.02 % nebulizer solution Take 500 mcg by nebulization 4 (four) times daily.      Marland Kitchen levothyroxine (SYNTHROID, LEVOTHROID) 50 MCG tablet Take 50 mcg by mouth daily.        Marland Kitchen LIPITOR 20 MG tablet TAKE (1) TABLET DAILY AT BEDTIME.  30 each  6  . naproxen sodium (ANAPROX) 550 MG tablet Take 550 mg by mouth 2 (two) times daily with a meal.      . omeprazole (PRILOSEC) 20 MG capsule Take 20 mg by mouth daily.      . pramipexole (MIRAPEX) 1 MG tablet Take 1 mg by mouth daily.      . promethazine (PHENERGAN) 25 MG tablet Take 25 mg by mouth every 6 (six) hours as needed.        Marland Kitchen  traMADol-acetaminophen (ULTRACET) 37.5-325 MG per tablet Take 1 tablet by mouth 2 (two) times daily.      Marland Kitchen triamcinolone cream (KENALOG) 0.1 % Apply 1 application topically 2 (two) times daily.       History   Social History  . Marital Status: Legally Separated    Spouse Name: N/A    Number of Children: N/A  . Years of Education: N/A   Occupational History  . Not on file.   Social History Main Topics  . Smoking status: Current Everyday Smoker -- 0.5 packs/day for 48 years    Types: Cigarettes  . Smokeless tobacco: Never Used   Comment: not ready to quit  . Alcohol Use: No     Remote heavy use  . Drug Use: Yes     Marijuana  . Sexually Active: Not on file   Other Topics Concern  . Not on file   Social History Narrative  . No narrative on file     Physical Exam: Filed Vitals:   09/03/11 0955  BP: 108/69  Pulse: 64  Height: 5\' 2"  (1.575 m)  Weight: 183 lb (83.008  kg)    GEN- The patient is chronically ill appearing, alert and oriented x 3 today.   Head- normocephalic, atraumatic Eyes-  Sclera clear, conjunctiva pink Ears- hearing intact Oropharynx- clear Lungs- Clear with a prolonged expiratory phase, normal work of breathing Chest- ICD pocket is well healed Heart- Regular rate and rhythm, no murmurs, rubs or gallops, PMI not laterally displaced GI- soft, NT, ND, + BS Extremities- no clubbing, cyanosis, or edema  ICD interrogation- reviewed in detail today,  See PACE[facility] report  Assessment and Plan:

## 2011-09-03 NOTE — Patient Instructions (Signed)
Your physician you to follow up in 1 year with Dr. Allred. You will receive a reminder letter in the mail one-two months in advance. If you don't receive a letter, please call our office to schedule the follow-up appointment. Your physician recommends that you continue on your current medications as directed. Please refer to the Current Medication list given to you today. 

## 2011-12-10 ENCOUNTER — Encounter: Payer: Medicare Other | Admitting: *Deleted

## 2011-12-16 ENCOUNTER — Encounter: Payer: Self-pay | Admitting: *Deleted

## 2012-03-11 DEATH — deceased

## 2012-04-29 ENCOUNTER — Encounter: Payer: Self-pay | Admitting: Internal Medicine

## 2012-04-29 ENCOUNTER — Telehealth: Payer: Self-pay | Admitting: Internal Medicine

## 2012-04-29 NOTE — Telephone Encounter (Signed)
04-29-12 called pt re past due device ck, number disconnected, will send past due letter/mt

## 2012-05-19 ENCOUNTER — Encounter: Payer: Self-pay | Admitting: *Deleted

## 2012-06-29 ENCOUNTER — Telehealth: Payer: Self-pay | Admitting: Internal Medicine

## 2012-06-29 NOTE — Telephone Encounter (Signed)
Noted in paceart.  

## 2012-06-29 NOTE — Telephone Encounter (Signed)
06-29-12 past due device check certified letter rtn mail stating moved left no forwarding address,  called pt home number just rings/called contact danielle norris, lm @ [phone] for her to have pt contact our office/mt

## 2012-10-11 ENCOUNTER — Encounter: Payer: Self-pay | Admitting: Internal Medicine

## 2012-10-11 ENCOUNTER — Telehealth: Payer: Self-pay | Admitting: Internal Medicine

## 2012-10-11 NOTE — Telephone Encounter (Signed)
10-11-12 called pt at 350pm n/s, unable to leave message, sent past due letter, needs pacer ck with allred in [place]/mt

## 2012-11-24 ENCOUNTER — Telehealth: Payer: Self-pay | Admitting: Internal Medicine

## 2013-12-01 NOTE — Telephone Encounter (Signed)
Close encounter
